# Patient Record
Sex: Male | Born: 1960 | Race: Black or African American | Hispanic: No | Marital: Married | Smoking: Current every day smoker
Health system: Southern US, Community
[De-identification: ages and names within clinical notes are randomized; demographics above are authoritative.]

## PROBLEM LIST (undated history)

## (undated) DIAGNOSIS — R569 Unspecified convulsions: Secondary | ICD-10-CM

## (undated) DIAGNOSIS — Z8669 Personal history of other diseases of the nervous system and sense organs: Secondary | ICD-10-CM

## (undated) HISTORY — PX: HEMORRHOID SURGERY: SHX153

## (undated) HISTORY — PX: SHOULDER OPEN ROTATOR CUFF REPAIR: SHX2407

## (undated) HISTORY — DX: Unspecified convulsions: R56.9

---

## 2017-09-08 HISTORY — PX: SHOULDER ARTHROSCOPY WITH ROTATOR CUFF REPAIR: SHX5685

## 2020-04-19 ENCOUNTER — Ambulatory Visit: Payer: Self-pay

## 2020-05-07 ENCOUNTER — Emergency Department (HOSPITAL_COMMUNITY)
Admission: EM | Admit: 2020-05-07 | Discharge: 2020-05-07 | Disposition: A | Discharge status: Home / Self Care | Payer: Medicare Other | Attending: Emergency Medicine | Admitting: Emergency Medicine

## 2020-05-07 ENCOUNTER — Other Ambulatory Visit: Payer: Self-pay

## 2020-05-07 ENCOUNTER — Emergency Department (HOSPITAL_COMMUNITY): Payer: Medicare Other

## 2020-05-07 DIAGNOSIS — R519 Headache, unspecified: Secondary | ICD-10-CM | POA: Diagnosis not present

## 2020-05-07 DIAGNOSIS — G40909 Epilepsy, unspecified, not intractable, without status epilepticus: Secondary | ICD-10-CM | POA: Insufficient documentation

## 2020-05-07 LAB — BASIC METABOLIC PANEL
Anion gap: 9 (ref 5–15)
BUN: 13 mg/dL (ref 6–20)
CO2: 22 mmol/L (ref 22–32)
Calcium: 9.3 mg/dL (ref 8.9–10.3)
Chloride: 109 mmol/L (ref 98–111)
Creatinine, Ser: 0.95 mg/dL (ref 0.61–1.24)
GFR calc Af Amer: >60 mL/min (ref >60)
GFR calc non Af Amer: >60 mL/min (ref >60)
Glucose, Bld: 97 mg/dL (ref 70–99)
Potassium: 3.7 mmol/L (ref 3.5–5.1)
Sodium: 140 mmol/L (ref 135–145)

## 2020-05-07 LAB — TROPONIN I (HIGH SENSITIVITY): Troponin I (High Sensitivity): 3 ng/L (ref <18)

## 2020-05-07 LAB — CBC
HCT: 48.8 % (ref 39.0–52.0)
Hemoglobin: 15.4 g/dL (ref 13.0–17.0)
MCH: 29.1 pg (ref 26.0–34.0)
MCHC: 31.6 g/dL (ref 30.0–36.0)
MCV: 92.2 fL (ref 80.0–100.0)
Platelets: 165 10*3/uL (ref 150–400)
RBC: 5.29 MIL/uL (ref 4.22–5.81)
RDW: 15.1 % (ref 11.5–15.5)
WBC: 5 10*3/uL (ref 4.0–10.5)
nRBC: 0 % (ref 0.0–0.2)

## 2020-05-07 MED ORDER — LEVETIRACETAM 500 MG PO TABS: 1000.0000 mg | ORAL_TABLET | Freq: Every evening | ORAL | 0 refills | Status: DC

## 2020-05-07 NOTE — ED Provider Notes (Signed)
[facility] EMERGENCY DEPARTMENT Provider Note   CSN: [id number] Arrival date & time: 05/07/20  1117     History Chief Complaint  Patient presents with   Seizures    Jeffery Simmons is a 59 y.o. male with reported history of seizures previously on Keppra presents to the ED for evaluation of seizure.  Patient tells me that he woke up this morning with a mild headache.  The headache gradually worsened while at work.  He walked into the bathroom and splashed some water in his face and neck and when he was walking out of the bathroom he remembers feeling lightheaded but after that he does remember what happened.  They told him that he had a seizure.  Continues to report a mild headache.  Admits to having being diagnosed with seizures in the past with an EEG.  He used to be on Keppra 500 mg twice a day but has not been taking this medicine for 2 to 3 months.  He recently relocated from [place] and has not seen a neurologist for refills.  Denies any other symptoms including vision changes, nausea, vomiting, neck pain.  No one-sided weakness or numbness.  Denies tongue biting or bladder or bowel incontinence.  States he usually gets a headache right before and after having a seizure.  Denies illicit drug or alcohol use.  HPI     No past medical history on file.  There are no problems to display for this patient.   ** The histories are not reviewed yet. Please review them in the "History" navigator section and refresh this SmartLink.     No family history on file.  Social History   Tobacco Use   Smoking status: Not on file  Substance Use Topics   Alcohol use: Not on file   Drug use: Not on file    Home Medications Prior to Admission medications   Medication Sig Start Date End Date Taking? Authorizing Provider  levETIRAcetam (KEPPRA) 500 MG tablet Take 2 tablets (1,000 mg total) by mouth at bedtime. 05/07/20 06/06/20  Liberty Handy, PA-C     Allergies    Patient has no allergy information on record.  Review of Systems   Review of Systems  Neurological: Positive for seizures.  All other systems reviewed and are negative.  Physical Exam Updated Vital Signs BP (!) 139/95 (BP Location: Right Arm)    Pulse (!) 49    Temp 98.1 F (36.7 C) (Oral)    Resp 12    Ht 5\' 9"  (1.753 m)    Wt 80.3 kg    SpO2 100%    BMI 26.14 kg/m   Physical Exam Vitals and nursing note reviewed.  Constitutional:      General: He is not in acute distress.    Appearance: He is well-developed.     Comments: NAD.  HENT:     Head: Normocephalic and atraumatic.     Comments: No facial or scalp tenderness, signs of trauma.    Right Ear: External ear normal.     Left Ear: External ear normal.     Nose: Nose normal.     Mouth/Throat:     Comments: No intraoral or tongue injury. Eyes:     General: No scleral icterus.    Conjunctiva/sclera: Conjunctivae normal.  Cardiovascular:     Rate and Rhythm: Normal rate and regular rhythm.     Heart sounds: Normal heart sounds. No murmur heard.   Pulmonary:  Effort: Pulmonary effort is normal.     Breath sounds: Normal breath sounds. No wheezing.  Musculoskeletal:        General: No deformity. Normal range of motion.     Cervical back: Normal range of motion and neck supple.  Skin:    General: Skin is warm and dry.     Capillary Refill: Capillary refill takes less than 2 seconds.  Neurological:     Mental Status: He is alert and oriented to person, place, and time.     Comments:  Mental Status: Patient is awake, alert, oriented to person, place, year, and situation. Patient is able to give a clear and coherent history.  Speech is fluent and clear without dysarthria or aphasia.  No signs of neglect.  Cranial Nerves: I not tested II visual fields full bilaterally. PERRL.  Unable to visualize posterior eye. III, IV, VI EOMs intact without ptosis or diplopia  V sensation to light touch intact in  all 3 divisions of trigeminal nerve bilaterally  VII facial movements symmetric bilaterally VIII hearing intact to voice/conversation  IX, X no uvula deviation, symmetric rise of soft palate/uvula XI 5/5 SCM and trapezius strength bilaterally  XII tongue protrusion midline, symmetric L/R movements  Motor: Strength 5/5 in upper/lower extremities.  Sensation to light touch intact in face, upper/lower extremities. No pronator drift. No leg drop.  Cerebellar: No ataxia with finger to nose. No truncal sway.   Psychiatric:        Behavior: Behavior normal.        Thought Content: Thought content normal.        Judgment: Judgment normal.     ED Results / Procedures / Treatments   Labs (all labs ordered are listed, but only abnormal results are displayed) Labs Reviewed  BASIC METABOLIC PANEL  CBC  CBG MONITORING, ED  TROPONIN I (HIGH SENSITIVITY)  TROPONIN I (HIGH SENSITIVITY)    EKG EKG Interpretation  Date/Time:  Monday May 07 2020 11:30:44 EDT Ventricular Rate:  60 PR Interval:  156 QRS Duration: 82 QT Interval:  402 QTC Calculation: 402 R Axis:   45 Text Interpretation: Normal sinus rhythm Confirmed by Virgina Norfolk [phone]) on 05/07/2020 11:45:17 AM   Radiology CT Head Wo Contrast  Result Date: 05/07/2020 CLINICAL DATA:  Headache, seizure EXAM: CT HEAD WITHOUT CONTRAST TECHNIQUE: Contiguous axial images were obtained from the base of the skull through the vertex without intravenous contrast. COMPARISON:  None available. FINDINGS: Brain: No evidence of acute infarction, hemorrhage, hydrocephalus, extra-axial collection or mass lesion/mass effect. Vascular: No hyperdense vessel or unexpected calcification. Skull: Normal. Negative for fracture or focal lesion. Sinuses/Orbits: No acute finding. Other: None. IMPRESSION: No acute intracranial findings. Electronically Signed   By: Duanne Guess D.O.   On: 05/07/2020 12:27    Procedures Procedures (including critical care  time)  Medications Ordered in ED Medications - No data to display  ED Course  I have reviewed the triage vital signs and the nursing notes.  Pertinent labs & imaging results that were available during my care of the patient were reviewed by me and considered in my medical decision making (see chart for details).    MDM Rules/Calculators/A&P                          EMR, triage nursing notes reviewed to assist with MDM and obtain more history.  Patient has not been seen in our system before.  59 year old male presents for  evaluation of seizure activity that was witnessed at work.  Reports prodromal and post seizure headache that he states he typically gets after his seizures.  Tells me he has been formally diagnosed with seizure disorder with an EEG and is supposed to be taking Keppra but has been noncompliant.  ER work-up initiated in triage including lab work, CT, EKG, troponin.  Your work-up personally visualized and interpreted.  CBC, BMP normal.  Troponin undetectable.  He had no cardiac symptoms to warrant a repeat troponin.  CT head is nonacute.  EKG shows normal sinus rhythm.  Exam is benign.  No neuro deficits.  He denies significant illicit drug or alcohol use.  Differential diagnosis includes breakthrough seizure from noncompliance with antiepileptic.  Will discharge with Keppra, neurology follow-up.  Return precautions discussed.  He is comfortable with this plan. Final Clinical Impression(s) / ED Diagnoses Final diagnoses:  Seizure disorder [facility])    Rx / DC Orders ED Discharge Orders         Ordered    levETIRAcetam (KEPPRA) 500 MG tablet  Nightly        05/07/20 1423           Liberty Handy, PA-C 05/07/20 1512    Melene Plan, DO 05/07/20 1531

## 2020-05-07 NOTE — Discharge Instructions (Signed)
You were seen in the ED for seizure  Lab work, head CT all were normal  I suspect your breakthrough seizure was from missing your Keppra the last few months  I have sent a prescription for Keppra 500 mg that you can take every night.  Please contact neurology clinic to establish care and make an appointment with neurology/seizure doctor and for future refills  Return to the ED for return or recurrent seizures, severe sudden or different headaches, vision changes, stroke symptoms

## 2020-05-07 NOTE — ED Triage Notes (Signed)
Pt had a headache this am while at work, and had an episode of syncope, hit head- had EMS at scene but did not want to go to [employer]-- Had a seizure, witnessed by coworker-  Hx of seizures, was on Kepra none for > 1-2 months--

## 2020-05-15 ENCOUNTER — Encounter: Payer: Self-pay | Admitting: Family Medicine

## 2020-05-15 ENCOUNTER — Other Ambulatory Visit: Payer: Self-pay

## 2020-05-15 ENCOUNTER — Ambulatory Visit (INDEPENDENT_AMBULATORY_CARE_PROVIDER_SITE_OTHER): Payer: Medicare Other | Admitting: Family Medicine

## 2020-05-15 VITALS — BP 122/76 | HR 59 | Temp 97.6°F | Ht 71.0 in | Wt 177.0 lb

## 2020-05-15 DIAGNOSIS — G40909 Epilepsy, unspecified, not intractable, without status epilepticus: Secondary | ICD-10-CM | POA: Diagnosis not present

## 2020-05-15 DIAGNOSIS — Z720 Tobacco use: Secondary | ICD-10-CM

## 2020-05-15 NOTE — Progress Notes (Signed)
Established Patient Office Visit  Subjective:  Patient ID: Jeffery Simmons, male    DOB: August 16, 1961  Age: 59 y.o. MRN: [id number]  CC:  Chief Complaint  Patient presents with  . Establish Care    hospital fu seizures, headaches, will need a work note to return to work    HPI Jeffery Simmons presents for hospital discharge follow-up status post experiencing a seizure on 30 August.  Apparently he has a history of seizure disorder and had discontinued his medicine for 2 to 3 months prior to the event.  History of generalized headaches that tend to occur after he forgets to eat.  Recently moved into the area from [place].  He had been placed on Keppra 500 mg 2 nightly by emergency room staff.  He saw his primary doctor in [place] who changed the dosage to 250 mg twice daily.  This is been his standing dose of Keppra that he had taken for years prior to discontinuation.  He has been seizure-free on this dosage for years.  He is accompanied by his wife today.  Past Medical History:  Diagnosis Date  . Seizures (HCC)     Past Surgical History:  Procedure Laterality Date  . HEMORRHOID SURGERY    . HEMORRHOID SURGERY    . SHOULDER ARTHROSCOPY WITH ROTATOR CUFF REPAIR Bilateral 2019  . SHOULDER OPEN ROTATOR CUFF REPAIR Bilateral 2019 2020    Family History  Problem Relation Age of Onset  . Stroke Mother     Social History   Socioeconomic History  . Marital status: Married    Spouse name: Not on file  . Number of children: Not on file  . Years of education: Not on file  . Highest education level: Not on file  Occupational History  . Not on file  Tobacco Use  . Smoking status: Current Every Day Smoker    Packs/day: 1.00    Types: Cigarettes  . Smokeless tobacco: Never Used  Vaping Use  . Vaping Use: Never used  Substance and Sexual Activity  . Alcohol use: Never  . Drug use: Never  . Sexual activity: Yes  Other Topics Concern  . Not on file  Social History  Narrative  . Not on file   Social Determinants of Health   Financial Resource Strain:   . Difficulty of Paying Living Expenses: Not on file  Food Insecurity:   . Worried About Programme researcher, broadcasting/film/video in the Last Year: Not on file  . Ran Out of Food in the Last Year: Not on file  Transportation Needs:   . Lack of Transportation (Medical): Not on file  . Lack of Transportation (Non-Medical): Not on file  Physical Activity:   . Days of Exercise per Week: Not on file  . Minutes of Exercise per Session: Not on file  Stress:   . Feeling of Stress : Not on file  Social Connections:   . Frequency of Communication with Friends and Family: Not on file  . Frequency of Social Gatherings with Friends and Family: Not on file  . Attends Religious Services: Not on file  . Active Member of Clubs or Organizations: Not on file  . Attends Banker Meetings: Not on file  . Marital Status: Not on file  Intimate Partner Violence:   . Fear of Current or Ex-Partner: Not on file  . Emotionally Abused: Not on file  . Physically Abused: Not on file  . Sexually Abused: Not on file  Outpatient Medications Prior to Visit  Medication Sig Dispense Refill  . levETIRAcetam (KEPPRA) 500 MG tablet Take 2 tablets (1,000 mg total) by mouth at bedtime. 60 tablet 0   No facility-administered medications prior to visit.    Allergies  Allergen Reactions  . Tylenol [Acetaminophen]     itching    ROS Review of Systems  Constitutional: Negative.   HENT: Negative.   Eyes: Negative for photophobia and visual disturbance.  Respiratory: Negative.   Cardiovascular: Negative.   Gastrointestinal: Negative.   Endocrine: Negative for polyphagia and polyuria.  Genitourinary: Negative.   Musculoskeletal: Negative for gait problem and joint swelling.  Skin: Negative for pallor and rash.  Neurological: Positive for seizures and headaches. Negative for tremors and speech difficulty.  Hematological: Does not  bruise/bleed easily.  Psychiatric/Behavioral: Negative.       Objective:    Physical Exam Vitals and nursing note reviewed.  Constitutional:      General: He is not in acute distress.    Appearance: Normal appearance. He is normal weight. He is not ill-appearing, toxic-appearing or diaphoretic.  HENT:     Head: Normocephalic and atraumatic.     Right Ear: Tympanic membrane, ear canal and external ear normal.     Left Ear: Tympanic membrane, ear canal and external ear normal.     Nose: Nose normal.     Mouth/Throat:     Mouth: Mucous membranes are dry.     Pharynx: Oropharynx is clear.  Eyes:     General: No scleral icterus.       Right eye: No discharge.        Left eye: No discharge.     Extraocular Movements: Extraocular movements intact.     Conjunctiva/sclera: Conjunctivae normal.     Pupils: Pupils are equal, round, and reactive to light.  Cardiovascular:     Rate and Rhythm: Normal rate and regular rhythm.  Pulmonary:     Effort: Pulmonary effort is normal.     Breath sounds: Normal breath sounds.  Musculoskeletal:     Cervical back: No rigidity.     Right lower leg: No edema.     Left lower leg: No edema.  Lymphadenopathy:     Cervical: No cervical adenopathy.  Skin:    General: Skin is warm and dry.  Neurological:     Mental Status: He is alert.     Cranial Nerves: No cranial nerve deficit, dysarthria or facial asymmetry.  Psychiatric:        Mood and Affect: Mood normal.        Behavior: Behavior normal.     BP 122/76   Pulse (!) 59   Temp 97.6 F (36.4 C) (Temporal)   Ht 5\' 11"  (1.803 m)   Wt 177 lb (80.3 kg)   SpO2 98%   BMI 24.69 kg/m  Wt Readings from Last 3 Encounters:  05/15/20 177 lb (80.3 kg)  05/07/20 177 lb (80.3 kg)     Health Maintenance Due  Topic Date Due  . Hepatitis C Screening  Never done  . HIV Screening  Never done  . TETANUS/TDAP  Never done  . COLONOSCOPY  Never done  . INFLUENZA VACCINE  Never done    There are no  preventive care reminders to display for this patient.  No results found for: TSH Lab Results  Component Value Date   WBC 5.0 05/07/2020   HGB 15.4 05/07/2020   HCT 48.8 05/07/2020   MCV 92.2 05/07/2020  PLT 165 05/07/2020   Lab Results  Component Value Date   NA 140 05/07/2020   K 3.7 05/07/2020   CO2 22 05/07/2020   GLUCOSE 97 05/07/2020   BUN 13 05/07/2020   CREATININE 0.95 05/07/2020   CALCIUM 9.3 05/07/2020   ANIONGAP 9 05/07/2020   No results found for: CHOL No results found for: HDL No results found for: LDLCALC No results found for: TRIG No results found for: CHOLHDL No results found for: QMVH8I    Assessment & Plan:   Problem List Items Addressed This Visit      Nervous and Auditory   Seizure disorder [facility]) - Primary   Relevant Orders   Ambulatory referral to Neurology     Other   Tobacco use      No orders of the defined types were placed in this encounter.   Follow-up: Return in about 3 months (around 08/14/2020), or For physical exam..   Given information on seizure disorder and also coping with quitting smoking.  We discussed the importance of remaining on his seizure medications. Mliss Sax, MD

## 2020-05-15 NOTE — Patient Instructions (Signed)
Epilepsy Epilepsy is when a person keeps having seizures. A seizure is a burst of abnormal activity in the brain. A seizure can change how you think or behave, and it can make it hard to be aware of what is happening. This condition can cause problems such as:  Falls, accidents, and injury.  Sadness (depression).  Poor memory.  Sudden unexplained death in epilepsy (SUDEP). This is rare. Its cause is not known. Most people with epilepsy lead normal lives. What are the causes? This condition may be caused by:  A head injury.  An injury that happens at birth.  A high fever during childhood.  A stroke.  Bleeding that goes into or around the brain.  Certain medicines and drugs.  Having too little oxygen for a long period of time.  Abnormal brain development.  Certain infections.  Brain tumors.  Conditions that are passed from parent to child (are hereditary). What are the signs or symptoms? Symptoms of a seizure vary from person to person. They may include:  Jerky movements of muscles (convulsions).  Stiffening of the body.  Movements of the arms or legs that you are not able to control.  Passing out (loss of consciousness).  Breathing problems.  Sudden falls.  Confusion.  Head nodding.  Eye blinking or twitching.  Lip smacking.  Drooling.  Fast eye movements.  Grunting.  Not being able to control when you pee or poop.  Staring.  Being hard to wake up (unresponsiveness). Some people have symptoms right before a seizure happens (aura) and right after a seizure happens. These symptoms include:  Fear or anxiety.  Feeling sick to your stomach (nauseous).  Feeling like the room is spinning (vertigo).  A feeling of having seen or heard something before (dj vu).  Odd tastes or smells.  Changes in how you see (vision), such as seeing flashing lights or spots. Symptoms that follow a seizure include:  Being confused.  Being sleepy.  Having a  headache. How is this treated? Treatment can control seizures. Treatment for this condition may involve:  Taking medicines to control seizures.  Having a device (vagus nerve stimulator) put in the chest. The device sends signals to a nerve and to the brain to prevent seizures.  Brain surgery to stop seizures from happening or to reduce how often they happen.  Having blood tests often to make sure you are getting the right amount of medicine. Once this condition has been diagnosed, it is important to start treatment as soon as possible. For some people, epilepsy goes away in time. Others will need treatment for the rest of their life. Follow these instructions at home: Medicines  Take over-the-counter and prescription medicines only as told by your doctor.  Avoid anything that may keep your medicine from working, such as alcohol. Activity  Get enough rest. Lack of sleep can make seizures more likely to occur.  Follow your doctor's advice about driving, swimming, and doing anything else that would be dangerous if you had a seizure. ? If you live in the [place] check with your local DMV (department of motor vehicles) to find out about local driving laws. Each state has rules about when you can return to driving. Teaching others Teach friends and family what to do if you have a seizure. They should:  Lay you on the ground to prevent a fall.  Cushion your head and body.  Loosen any tight clothing around your neck.  Turn you on your side.  Stay with   you until you are better.  Not hold you down.  Not put anything in your mouth.  Know whether or not you need emergency care.  General instructions  Avoid anything that causes you to have seizures.  Keep a seizure diary. Write down what you remember about each seizure. Be sure to include what might have caused it.  Keep all follow-up visits as told by your doctor. This is important. Contact a doctor if:  You have a change in how  often or when you have seizures.  You get an infection or start to feel sick. You may have more seizures when you are sick. Get help right away if:  A seizure does not stop after 5 minutes.  You have more than one seizure in a row, and you do not have enough time between the seizures to feel better.  A seizure makes it harder to breathe.  A seizure is different from other seizures you have had.  A seizure makes you unable to speak or use a part of your body.  You did not wake up right after a seizure. These symptoms may be an emergency. Do not wait to see if the symptoms will go away. Get medical help right away. Call your local emergency services (911 in the [place] Do not drive yourself to the hospital. Summary  Epilepsy is when a person keeps having seizures. A seizure is a burst of abnormal activity in the brain.  Treatment can control seizures.  Teach friends and family what to do if you have a seizure. This information is not intended to replace advice given to you by your health care provider. Make sure you discuss any questions you have with your health care provider. Document Revised: 04/19/2018 Document Reviewed: 04/19/2018 Elsevier Patient Education  2020 [employer].  Coping with Quitting Smoking  Quitting smoking is a physical and mental challenge. You will face cravings, withdrawal symptoms, and temptation. Before quitting, work with your health care provider to make a plan that can help you cope. Preparation can help you quit and keep you from giving in. How can I cope with cravings? Cravings usually last for 5-10 minutes. If you get through it, the craving will pass. Consider taking the following actions to help you cope with cravings:  Keep your mouth busy: ? Chew sugar-free gum. ? Suck on hard candies or a straw. ? Brush your teeth.  Keep your hands and body busy: ? Immediately change to a different activity when you feel a craving. ? Squeeze or play with a  ball. ? Do an activity or a hobby, like making bead jewelry, practicing needlepoint, or working with wood. ? Mix up your normal routine. ? Take a short exercise break. Go for a quick walk or run up and down stairs. ? Spend time in public places where smoking is not allowed.  Focus on doing something kind or helpful for someone else.  Call a friend or family member to talk during a craving.  Join a support group.  Call a quit line, such as 1-800-QUIT-NOW.  Talk with your health care provider about medicines that might help you cope with cravings and make quitting easier for you. How can I deal with withdrawal symptoms? Your body may experience negative effects as it tries to get used to not having nicotine in the system. These effects are called withdrawal symptoms. They may include:  Feeling hungrier than normal.  Trouble concentrating.  Irritability.  Trouble sleeping.  Feeling depressed.  Restlessness and agitation.  Craving a cigarette. To manage withdrawal symptoms:  Avoid places, people, and activities that trigger your cravings.  Remember why you want to quit.  Get plenty of sleep.  Avoid coffee and other caffeinated drinks. These may worsen some of your symptoms. How can I handle social situations? Social situations can be difficult when you are quitting smoking, especially in the first few weeks. To manage this, you can:  Avoid parties, bars, and other social situations where people might be smoking.  Avoid alcohol.  Leave right away if you have the urge to smoke.  Explain to your family and friends that you are quitting smoking. Ask for understanding and support.  Plan activities with friends or family where smoking is not an option. What are some ways I can cope with stress? Wanting to smoke may cause stress, and stress can make you want to smoke. Find ways to manage your stress. Relaxation techniques can help. For example:  Breathe slowly and deeply,  in through your nose and out through your mouth.  Listen to soothing, relaxing music.  Talk with a family member or friend about your stress.  Light a candle.  Soak in a bath or take a shower.  Think about a peaceful place. What are some ways I can prevent weight gain? Be aware that many people gain weight after they quit smoking. However, not everyone does. To keep from gaining weight, have a plan in place before you quit and stick to the plan after you quit. Your plan should include:  Having healthy snacks. When you have a craving, it may help to: ? Eat plain popcorn, crunchy carrots, celery, or other cut vegetables. ? Chew sugar-free gum.  Changing how you eat: ? Eat small portion sizes at meals. ? Eat 4-6 small meals throughout the day instead of 1-2 large meals a day. ? Be mindful when you eat. Do not watch television or do other things that might distract you as you eat.  Exercising regularly: ? Make time to exercise each day. If you do not have time for a long workout, do short bouts of exercise for 5-10 minutes several times a day. ? Do some form of strengthening exercise, like weight lifting, and some form of aerobic exercise, like running or swimming.  Drinking plenty of water or other low-calorie or no-calorie drinks. Drink 6-8 glasses of water daily, or as much as instructed by your health care provider. Summary  Quitting smoking is a physical and mental challenge. You will face cravings, withdrawal symptoms, and temptation to smoke again. Preparation can help you as you go through these challenges.  You can cope with cravings by keeping your mouth busy (such as by chewing gum), keeping your body and hands busy, and making calls to family, friends, or a helpline for people who want to quit smoking.  You can cope with withdrawal symptoms by avoiding places where people smoke, avoiding drinks with caffeine, and getting plenty of rest.  Ask your health care provider about  the different ways to prevent weight gain, avoid stress, and handle social situations. This information is not intended to replace advice given to you by your health care provider. Make sure you discuss any questions you have with your health care provider. Document Revised: 08/07/2017 Document Reviewed: 08/22/2016 Elsevier Patient Education  2020 [employer].

## 2020-06-05 ENCOUNTER — Encounter: Payer: Self-pay | Admitting: Family Medicine

## 2020-08-10 ENCOUNTER — Ambulatory Visit: Payer: Medicare Other | Admitting: Family Medicine

## 2020-09-14 ENCOUNTER — Encounter: Payer: Self-pay | Admitting: Diagnostic Neuroimaging

## 2020-09-14 ENCOUNTER — Ambulatory Visit: Payer: Medicare Other | Admitting: Diagnostic Neuroimaging

## 2020-11-23 ENCOUNTER — Ambulatory Visit: Payer: Medicare Other | Admitting: Diagnostic Neuroimaging

## 2020-12-31 ENCOUNTER — Ambulatory Visit (INDEPENDENT_AMBULATORY_CARE_PROVIDER_SITE_OTHER): Payer: Medicare Other

## 2020-12-31 ENCOUNTER — Ambulatory Visit (INDEPENDENT_AMBULATORY_CARE_PROVIDER_SITE_OTHER): Payer: Medicare Other | Admitting: Family Medicine

## 2020-12-31 ENCOUNTER — Other Ambulatory Visit: Payer: Self-pay

## 2020-12-31 ENCOUNTER — Encounter: Payer: Self-pay | Admitting: Family Medicine

## 2020-12-31 VITALS — BP 132/80 | HR 82 | Temp 97.7°F | Ht 71.0 in | Wt 172.2 lb

## 2020-12-31 DIAGNOSIS — E291 Testicular hypofunction: Secondary | ICD-10-CM | POA: Insufficient documentation

## 2020-12-31 DIAGNOSIS — M25519 Pain in unspecified shoulder: Secondary | ICD-10-CM

## 2020-12-31 DIAGNOSIS — Z0000 Encounter for general adult medical examination without abnormal findings: Secondary | ICD-10-CM

## 2020-12-31 DIAGNOSIS — G40909 Epilepsy, unspecified, not intractable, without status epilepticus: Secondary | ICD-10-CM

## 2020-12-31 DIAGNOSIS — Z720 Tobacco use: Secondary | ICD-10-CM | POA: Diagnosis not present

## 2020-12-31 DIAGNOSIS — Z9889 Other specified postprocedural states: Secondary | ICD-10-CM

## 2020-12-31 MED ORDER — MELOXICAM 7.5 MG PO TABS: 7.5000 mg | ORAL_TABLET | Freq: Every day | ORAL | 0 refills | Status: DC

## 2020-12-31 NOTE — Patient Instructions (Signed)
Health Maintenance, Male Adopting a healthy lifestyle and getting preventive care are important in promoting health and wellness. Ask your health care provider about:  The right schedule for you to have regular tests and exams.  Things you can do on your own to prevent diseases and keep yourself healthy. What should I know about diet, weight, and exercise? Eat a healthy diet  Eat a diet that includes plenty of vegetables, fruits, low-fat dairy products, and lean protein.  Do not eat a lot of foods that are high in solid fats, added sugars, or sodium.   Maintain a healthy weight Body mass index (BMI) is a measurement that can be used to identify possible weight problems. It estimates body fat based on height and weight. Your health care provider can help determine your BMI and help you achieve or maintain a healthy weight. Get regular exercise Get regular exercise. This is one of the most important things you can do for your health. Most adults should:  Exercise for at least 150 minutes each week. The exercise should increase your heart rate and make you sweat (moderate-intensity exercise).  Do strengthening exercises at least twice a week. This is in addition to the moderate-intensity exercise.  Spend less time sitting. Even light physical activity can be beneficial. Watch cholesterol and blood lipids Have your blood tested for lipids and cholesterol at 59 years of age, then have this test every 5 years. You may need to have your cholesterol levels checked more often if:  Your lipid or cholesterol levels are high.  You are older than 59 years of age.  You are at high risk for heart disease. What should I know about cancer screening? Many types of cancers can be detected early and may often be prevented. Depending on your health history and family history, you may need to have cancer screening at various ages. This may include screening for:  Colorectal cancer.  Prostate  cancer.  Skin cancer.  Lung cancer. What should I know about heart disease, diabetes, and high blood pressure? Blood pressure and heart disease  High blood pressure causes heart disease and increases the risk of stroke. This is more likely to develop in people who have high blood pressure readings, are of African descent, or are overweight.  Talk with your health care provider about your target blood pressure readings.  Have your blood pressure checked: ? Every 3-5 years if you are 18-39 years of age. ? Every year if you are 40 years old or older.  If you are between the ages of 65 and 75 and are a current or former smoker, ask your health care provider if you should have a one-time screening for abdominal aortic aneurysm (AAA). Diabetes Have regular diabetes screenings. This checks your fasting blood sugar level. Have the screening done:  Once every three years after age 45 if you are at a normal weight and have a low risk for diabetes.  More often and at a younger age if you are overweight or have a high risk for diabetes. What should I know about preventing infection? Hepatitis B If you have a higher risk for hepatitis B, you should be screened for this virus. Talk with your health care provider to find out if you are at risk for hepatitis B infection. Hepatitis C Blood testing is recommended for:  Everyone born from 1945 through 1965.  Anyone with known risk factors for hepatitis C. Sexually transmitted infections (STIs)  You should be screened each   year for STIs, including gonorrhea and chlamydia, if: ? You are sexually active and are younger than 59 years of age. ? You are older than 59 years of age and your health care provider tells you that you are at risk for this type of infection. ? Your sexual activity has changed since you were last screened, and you are at increased risk for chlamydia or gonorrhea. Ask your health care provider if you are at risk.  Ask your  health care provider about whether you are at high risk for HIV. Your health care provider may recommend a prescription medicine to help prevent HIV infection. If you choose to take medicine to prevent HIV, you should first get tested for HIV. You should then be tested every 3 months for as long as you are taking the medicine. Follow these instructions at home: Lifestyle  Do not use any products that contain nicotine or tobacco, such as cigarettes, e-cigarettes, and chewing tobacco. If you need help quitting, ask your health care provider.  Do not use street drugs.  Do not share needles.  Ask your health care provider for help if you need support or information about quitting drugs. Alcohol use  Do not drink alcohol if your health care provider tells you not to drink.  If you drink alcohol: ? Limit how much you have to 0-2 drinks a day. ? Be aware of how much alcohol is in your drink. In the [place] one drink equals one 12 oz bottle of beer (355 mL), one 5 oz glass of wine (148 mL), or one 1 oz glass of hard liquor (44 mL). General instructions  Schedule regular health, dental, and eye exams.  Stay current with your vaccines.  Tell your health care provider if: ? You often feel depressed. ? You have ever been abused or do not feel safe at home. Summary  Adopting a healthy lifestyle and getting preventive care are important in promoting health and wellness.  Follow your health care provider's instructions about healthy diet, exercising, and getting tested or screened for diseases.  Follow your health care provider's instructions on monitoring your cholesterol and blood pressure. This information is not intended to replace advice given to you by your health care provider. Make sure you discuss any questions you have with your health care provider. Document Revised: 08/18/2018 Document Reviewed: 08/18/2018 Elsevier Patient Education  2021 [place] Years  Old, Male Preventive care refers to lifestyle choices and visits with your health care provider that can promote health and wellness. This includes:  A yearly physical exam. This is also called an annual wellness visit.  Regular dental and eye exams.  Immunizations.  Screening for certain conditions.  Healthy lifestyle choices, such as: ? Eating a healthy diet. ? Getting regular exercise. ? Not using drugs or products that contain nicotine and tobacco. ? Limiting alcohol use. What can I expect for my preventive care visit? Physical exam Your health care provider will check your:  Height and weight. These may be used to calculate your BMI (body mass index). BMI is a measurement that tells if you are at a healthy weight.  Heart rate and blood pressure.  Body temperature.  Skin for abnormal spots. Counseling Your health care provider may ask you questions about your:  Past medical problems.  Family's medical history.  Alcohol, tobacco, and drug use.  Emotional well-being.  Home life and relationship well-being.  Sexual activity.  Diet, exercise, and sleep habits.  Work and work Statistician.  Access to firearms. What immunizations do I need? Vaccines are usually given at various ages, according to a schedule. Your health care provider will recommend vaccines for you based on your age, medical history, and lifestyle or other factors, such as travel or where you work.   What tests do I need? Blood tests  Lipid and cholesterol levels. These may be checked every 5 years, or more often if you are over 44 years old.  Hepatitis C test.  Hepatitis B test. Screening  Lung cancer screening. You may have this screening every year starting at age 77 if you have a 30-pack-year history of smoking and currently smoke or have quit within the past 15 years.  Prostate cancer screening. Recommendations will vary depending on your family history and other risks.  Genital exam  to check for testicular cancer or hernias.  Colorectal cancer screening. ? All adults should have this screening starting at age 36 and continuing until age 28. ? Your health care provider may recommend screening at age 16 if you are at increased risk. ? You will have tests every 1-10 years, depending on your results and the type of screening test.  Diabetes screening. ? This is done by checking your blood sugar (glucose) after you have not eaten for a while (fasting). ? You may have this done every 1-3 years.  STD (sexually transmitted disease) testing, if you are at risk. Follow these instructions at home: Eating and drinking  Eat a diet that includes fresh fruits and vegetables, whole grains, lean protein, and low-fat dairy products.  Take vitamin and mineral supplements as recommended by your health care provider.  Do not drink alcohol if your health care provider tells you not to drink.  If you drink alcohol: ? Limit how much you have to 0-2 drinks a day. ? Be aware of how much alcohol is in your drink. In the [place] one drink equals one 12 oz bottle of beer (355 mL), one 5 oz glass of wine (148 mL), or one 1 oz glass of hard liquor (44 mL).   Lifestyle  Take daily care of your teeth and gums. Brush your teeth every morning and night with fluoride toothpaste. Floss one time each day.  Stay active. Exercise for at least 30 minutes 5 or more days each week.  Do not use any products that contain nicotine or tobacco, such as cigarettes, e-cigarettes, and chewing tobacco. If you need help quitting, ask your health care provider.  Do not use drugs.  If you are sexually active, practice safe sex. Use a condom or other form of protection to prevent STIs (sexually transmitted infections).  If told by your health care provider, take low-dose aspirin daily starting at age 10.  Find healthy ways to cope with stress, such as: ? Meditation, yoga, or listening to  music. ? Journaling. ? Talking to a trusted person. ? Spending time with friends and family. Safety  Always wear your seat belt while driving or riding in a vehicle.  Do not drive: ? If you have been drinking alcohol. Do not ride with someone who has been drinking. ? When you are tired or distracted. ? While texting.  Wear a helmet and other protective equipment during sports activities.  If you have firearms in your house, make sure you follow all gun safety procedures. What's next?  Go to your health care provider once a year for an annual wellness visit.  Ask your health  care provider how often you should have your eyes and teeth checked.  Stay up to date on all vaccines. This information is not intended to replace advice given to you by your health care provider. Make sure you discuss any questions you have with your health care provider. Document Revised: 05/24/2019 Document Reviewed: 08/19/2018 Elsevier Patient Education  2021 Else[employer] Inc.  Steps to Quit Smoking Smoking tobacco is the leading cause of preventable death. It can affect almost every organ in the body. Smoking puts you and people around you at risk for many serious, long-lasting (chronic) diseases. Quitting smoking can be hard, but it is one of the best things that you can do for your health. It is never too late to quit. How do I get ready to quit? When you decide to quit smoking, make a plan to help you succeed. Before you quit:  Pick a date to quit. Set a date within the next 2 weeks to give you time to prepare.  Write down the reasons why you are quitting. Keep this list in places where you will see it often.  Tell your family, friends, and co-workers that you are quitting. Their support is important.  Talk with your doctor about the choices that may help you quit.  Find out if your health insurance will pay for these treatments.  Know the people, places, things, and activities that make you want to  smoke (triggers). Avoid them. What first steps can I take to quit smoking?  Throw away all cigarettes at home, at work, and in your car.  Throw away the things that you use when you smoke, such as ashtrays and lighters.  Clean your car. Make sure to empty the ashtray.  Clean your home, including curtains and carpets. What can I do to help me quit smoking? Talk with your doctor about taking medicines and seeing a counselor at the same time. You are more likely to succeed when you do both.  If you are pregnant or breastfeeding, talk with your doctor about counseling or other ways to quit smoking. Do not take medicine to help you quit smoking unless your doctor tells you to do so. To quit smoking: Quit right away  Quit smoking totally, instead of slowly cutting back on how much you smoke over a period of time.  Go to counseling. You are more likely to quit if you go to counseling sessions regularly. Take medicine You may take medicines to help you quit. Some medicines need a prescription, and some you can buy over-the-counter. Some medicines may contain a drug called nicotine to replace the nicotine in cigarettes. Medicines may:  Help you to stop having the desire to smoke (cravings).  Help to stop the problems that come when you stop smoking (withdrawal symptoms). Your doctor may ask you to use:  Nicotine patches, gum, or lozenges.  Nicotine inhalers or sprays.  Non-nicotine medicine that is taken by mouth. Find resources Find resources and other ways to help you quit smoking and remain smoke-free after you quit. These resources are most helpful when you use them often. They include:  Online chats with a Veterinary surgeon.  Phone quitlines.  Printed Materials engineer.  Support groups or group counseling.  Text messaging programs.  Mobile phone apps. Use apps on your mobile phone or tablet that can help you stick to your quit plan. There are many free apps for mobile phones and  tablets as well as websites. Examples include Quit Guide from the [employer]  and smokefree.gov   What things can I do to make it easier to quit?  Talk to your family and friends. Ask them to support and encourage you.  Call a phone quitline (1-800-QUIT-NOW), reach out to support groups, or work with a Veterinary surgeon.  Ask people who smoke to not smoke around you.  Avoid places that make you want to smoke, such as: ? Bars. ? Parties. ? Smoke-break areas at work.  Spend time with people who do not smoke.  Lower the stress in your life. Stress can make you want to smoke. Try these things to help your stress: ? Getting regular exercise. ? Doing deep-breathing exercises. ? Doing yoga. ? Meditating. ? Doing a body scan. To do this, close your eyes, focus on one area of your body at a time from head to toe. Notice which parts of your body are tense. Try to relax the muscles in those areas.   How will I feel when I quit smoking? Day 1 to 3 weeks Within the first 24 hours, you may start to have some problems that come from quitting tobacco. These problems are very bad 2-3 days after you quit, but they do not often last for more than 2-3 weeks. You may get these symptoms:  Mood swings.  Feeling restless, nervous, angry, or annoyed.  Trouble concentrating.  Dizziness.  Strong desire for high-sugar foods and nicotine.  Weight gain.  Trouble pooping (constipation).  Feeling like you may vomit (nausea).  Coughing or a sore throat.  Changes in how the medicines that you take for other issues work in your body.  Depression.  Trouble sleeping (insomnia). Week 3 and afterward After the first 2-3 weeks of quitting, you may start to notice more positive results, such as:  Better sense of smell and taste.  Less coughing and sore throat.  Slower heart rate.  Lower blood pressure.  Clearer skin.  Better breathing.  Fewer sick days. Quitting smoking can be hard. Do not give up if you  fail the first time. Some people need to try a few times before they succeed. Do your best to stick to your quit plan, and talk with your doctor if you have any questions or concerns. Summary  Smoking tobacco is the leading cause of preventable death. Quitting smoking can be hard, but it is one of the best things that you can do for your health.  When you decide to quit smoking, make a plan to help you succeed.  Quit smoking right away, not slowly over a period of time.  When you start quitting, seek help from your doctor, family, or friends. This information is not intended to replace advice given to you by your health care provider. Make sure you discuss any questions you have with your health care provider. Document Revised: 05/20/2019 Document Reviewed: 11/13/2018 Elsevier Patient Education  2021 Elsevier Inc. Varenicline oral tablets What is this medicine? VARENICLINE (var e NI kleen) is used to help people quit smoking. It is used with a patient support program recommended by your physician. This medicine may be used for other purposes; ask your health care provider or pharmacist if you have questions. COMMON BRAND NAME(S): Chantix What should I tell my health care provider before I take this medicine? They need to know if you have any of these conditions:  heart disease  if you often drink alcohol  kidney disease  mental illness  on hemodialysis  seizures  history of stroke  suicidal thoughts, plans,  or attempt; a previous suicide attempt by you or a family member  an unusual or allergic reaction to varenicline, other medicines, foods, dyes, or preservatives  pregnant or trying to get pregnant  breast-feeding How should I use this medicine? Take this medicine by mouth after eating. Take with a full glass of water. Follow the directions on the prescription label. Take your doses at regular intervals. Do not take your medicine more often than directed. There are 3  ways you can use this medicine to help you quit smoking; talk to your health care professional to decide which plan is right for you: 1) you can choose a quit date and start this medicine 1 week before the quit date, or, 2) you can start taking this medicine before you choose a quit date, and then pick a quit date between day 8 and 35 days of treatment, or, 3) if you are not sure that you are able or willing to quit smoking right away, start taking this medicine and slowly decrease the amount you smoke as directed by your health care professional with the goal of being cigarette-free by week 12 of treatment. Stick to your plan; ask about support groups or other ways to help you remain cigarette-free. If you are motivated to quit smoking and did not succeed during a previous attempt with this medicine for reasons other than side effects, or if you returned to smoking after this treatment, speak with your health care professional about whether another course of this medicine may be right for you. A special MedGuide will be given to you by the pharmacist with each prescription and refill. Be sure to read this information carefully each time. Talk to your pediatrician regarding the use of this medicine in children. This medicine is not approved for use in children. Overdosage: If you think you have taken too much of this medicine contact a poison control center or emergency room at once. NOTE: This medicine is only for you. Do not share this medicine with others. What if I miss a dose? If you miss a dose, take it as soon as you can. If it is almost time for your next dose, take only that dose. Do not take double or extra doses. What may interact with this medicine?  alcohol  insulin  other medicines used to help people quit smoking  theophylline  warfarin This list may not describe all possible interactions. Give your health care provider a list of all the medicines, herbs, non-prescription drugs,  or dietary supplements you use. Also tell them if you smoke, drink alcohol, or use illegal drugs. Some items may interact with your medicine. What should I watch for while using this medicine? It is okay if you do not succeed at your attempt to quit and have a cigarette. You can still continue your quit attempt and keep using this medicine as directed. Just throw away your cigarettes and get back to your quit plan. Talk to your health care provider before using other treatments to quit smoking. Using this medicine with other treatments to quit smoking may increase the risk for side effects compared to using a treatment alone. You may get drowsy or dizzy. Do not drive, use machinery, or do anything that needs mental alertness until you know how this medicine affects you. Do not stand or sit up quickly, especially if you are an older patient. This reduces the risk of dizzy or fainting spells. Decrease the number of alcoholic beverages that you drink  during treatment with this medicine until you know if this medicine affects your ability to tolerate alcohol. Some people have experienced increased drunkenness (intoxication), unusual or sometimes aggressive behavior, or no memory of things that have happened (amnesia) during treatment with this medicine. Sleepwalking can happen during treatment with this medicine, and can sometimes lead to behavior that is harmful to you, other people, or property. Stop taking this medicine and tell your doctor if you start sleepwalking or have other unusual sleep-related activity. After taking this medicine, you may get up out of bed and do an activity that you do not know you are doing. The next morning, you may have no memory of this. Activities include driving a car ("sleep-driving"), making and eating food, talking on the phone, sexual activity, and sleep-walking. Serious injuries have occurred. Stop the medicine and call your doctor right away if you find out you have done  any of these activities. Do not take this medicine if you have used alcohol that evening. Do not take it if you have taken another medicine for sleep. The risk of doing these sleep-related activities is higher. Patients and their families should watch out for new or worsening depression or thoughts of suicide. Also watch out for sudden changes in feelings such as feeling anxious, agitated, panicky, irritable, hostile, aggressive, impulsive, severely restless, overly excited and hyperactive, or not being able to sleep. If this happens, call your health care professional. If you have diabetes and you quit smoking, the effects of insulin may be increased and you may need to reduce your insulin dose. Check with your doctor or health care professional about how you should adjust your insulin dose. What side effects may I notice from receiving this medicine? Side effects that you should report to your doctor or health care professional as soon as possible:  allergic reactions like skin rash, itching or hives, swelling of the face, lips, tongue, or throat  acting aggressive, being angry or violent, or acting on dangerous impulses  breathing problems  changes in emotions or moods  chest pain or chest tightness  feeling faint or lightheaded, falls  hallucination, loss of contact with reality  mouth sores  redness, blistering, peeling or loosening of the skin, including inside the mouth  signs and symptoms of a stroke like changes in vision; confusion; trouble speaking or understanding; severe headaches; sudden numbness or weakness of the face, arm or leg; trouble walking; dizziness; loss of balance or coordination  seizures  sleepwalking  suicidal thoughts or other mood changes Side effects that usually do not require medical attention (report to your doctor or health care professional if they continue or are bothersome):  constipation  gas  headache  nausea, vomiting  strange  dreams  trouble sleeping This list may not describe all possible side effects. Call your doctor for medical advice about side effects. You may report side effects to FDA at 1-800-FDA-1088. Where should I keep my medicine? Keep out of the reach of children. Store at room temperature between 15 and 30 degrees C (59 and 86 degrees F). Throw away any unused medicine after the expiration date. NOTE: This sheet is a summary. It may not cover all possible information. If you have questions about this medicine, talk to your doctor, pharmacist, or health care provider.  2021 Elsevier/Gold Standard (2018-08-13 14:27:36)

## 2020-12-31 NOTE — Progress Notes (Signed)
Established Patient Office Visit  Subjective:  Patient ID: Jeffery Simmons, male    DOB: 18-Aug-1961  Age: 60 y.o. MRN: [id number]  CC:  Chief Complaint  Patient presents with  . Shoulder Pain    C/O shoulder pain both sides, would like a shot or something for the pain. Patient also states that testosterone levels have been low in the pass he would like levels checked again.     HPI Jeffery Simmons presents for bilateral shoulder pain.  Status post rotator cuff repair on both shoulders he tells me.  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  History of androgen deficiency.  He has tried what sounds like testosterone gel in the past that was not helpful.  History of tobacco use.  He has been smoking since he was 22.  Currently smoking a pack per day.  He understands that he needs to quit.  He has tried patches and gum without success.  He never saw the neurologist because he has to work throughout the day.  Keppra continues to control his seizures.  Past Medical History:  Diagnosis Date  . Seizures (HCC)     Past Surgical History:  Procedure Laterality Date  . HEMORRHOID SURGERY    . HEMORRHOID SURGERY    . SHOULDER ARTHROSCOPY WITH ROTATOR CUFF REPAIR Bilateral 2019  . SHOULDER OPEN ROTATOR CUFF REPAIR Bilateral 2019 2020    Family History  Problem Relation Age of Onset  . Stroke Mother     Social History   Socioeconomic History  . Marital status: Married    Spouse name: Not on file  . Number of children: Not on file  . Years of education: Not on file  . Highest education level: Not on file  Occupational History  . Not on file  Tobacco Use  . Smoking status: Current Every Day Smoker    Packs/day: 1.00    Types: Cigarettes  . Smokeless tobacco: Never Used  Vaping Use  . Vaping Use: Never used  Substance and Sexual Activity  . Alcohol use: Never  . Drug use: Never  . Sexual activity: Yes  Other Topics Concern  . Not on file   Social History Narrative  . Not on file   Social Determinants of Health   Financial Resource Strain: Not on file  Food Insecurity: Not on file  Transportation Needs: Not on file  Physical Activity: Not on file  Stress: Not on file  Social Connections: Not on file  Intimate Partner Violence: Not on file    Outpatient Medications Prior to Visit  Medication Sig Dispense Refill  . levETIRAcetam (KEPPRA) 250 MG tablet Take 250 mg by mouth 2 (two) times daily.     No facility-administered medications prior to visit.    Allergies  Allergen Reactions  . Tylenol [Acetaminophen]     itching    ROS Review of Systems  Constitutional: Negative for diaphoresis, fatigue, fever and unexpected weight change.  HENT: Negative.   Eyes: Negative for photophobia and visual disturbance.  Respiratory: Positive for cough. Negative for chest tightness, shortness of breath and wheezing.   Cardiovascular: Negative for chest pain and palpitations.  Gastrointestinal: Negative.   Endocrine: Negative for polyphagia and polyuria.  Genitourinary: Negative for difficulty urinating, frequency and urgency.  Musculoskeletal: Positive for arthralgias.  Skin: Negative for color change and pallor.  Neurological: Negative for weakness and headaches.  Psychiatric/Behavioral: Negative.       Objective:  Physical Exam Vitals and nursing note reviewed.  Constitutional:      General: He is not in acute distress.    Appearance: Normal appearance. He is normal weight. He is not ill-appearing, toxic-appearing or diaphoretic.  HENT:     Head: Normocephalic and atraumatic.     Right Ear: Tympanic membrane, ear canal and external ear normal.     Left Ear: Tympanic membrane, ear canal and external ear normal.     Mouth/Throat:     Mouth: Mucous membranes are moist.     Pharynx: Oropharynx is clear. No oropharyngeal exudate or posterior oropharyngeal erythema.  Eyes:     General: No scleral icterus.        Right eye: No discharge.        Left eye: No discharge.     Extraocular Movements: Extraocular movements intact.     Conjunctiva/sclera: Conjunctivae normal.     Pupils: Pupils are equal, round, and reactive to light.  Neck:     Vascular: No carotid bruit.  Cardiovascular:     Rate and Rhythm: Normal rate and regular rhythm.  Pulmonary:     Effort: Pulmonary effort is normal.     Breath sounds: Normal breath sounds.  Abdominal:     General: Abdomen is flat. Bowel sounds are normal. There is no distension.     Palpations: Abdomen is soft. There is no mass.     Tenderness: There is no abdominal tenderness. There is no guarding or rebound.     Hernia: A hernia is present. Hernia is present in the left inguinal area (small). There is no hernia in the right inguinal area.  Genitourinary:    Penis: No hypospadias, erythema, tenderness, discharge, swelling or lesions.      Testes:        Right: Mass, tenderness or swelling not present. Right testis is descended.        Left: Mass, tenderness or swelling not present. Left testis is descended.     Epididymis:     Right: Not inflamed.     Left: Not inflamed.     Prostate: Enlarged. Not tender and no nodules present.     Rectum: Guaiac result negative. No mass, tenderness, anal fissure, external hemorrhoid or internal hemorrhoid. Normal anal tone.  Musculoskeletal:     Right shoulder: Normal range of motion.     Left shoulder: Normal range of motion.       Arms:     Cervical back: No rigidity or tenderness.     Right lower leg: No edema.     Left lower leg: No edema.  Lymphadenopathy:     Cervical: No cervical adenopathy.     Lower Body: No right inguinal adenopathy. No left inguinal adenopathy.  Skin:    General: Skin is warm and dry.  Neurological:     Mental Status: He is alert and oriented to person, place, and time.  Psychiatric:        Mood and Affect: Mood normal.        Behavior: Behavior normal.     BP 132/80   Pulse 82    Temp 97.7 F (36.5 C) (Temporal)   Ht 5\' 11"  (1.803 m)   Wt 172 lb 3.2 oz (78.1 kg)   SpO2 95%   BMI 24.02 kg/m  Wt Readings from Last 3 Encounters:  12/31/20 172 lb 3.2 oz (78.1 kg)  05/15/20 177 lb (80.3 kg)  05/07/20 177 lb (80.3 kg)  Health Maintenance Due  Topic Date Due  . Hepatitis C Screening  Never done  . HIV Screening  Never done  . TETANUS/TDAP  Never done  . COLONOSCOPY (Pts 45-62yrs Insurance coverage will need to be confirmed)  Never done    There are no preventive care reminders to display for this patient.  No results found for: TSH Lab Results  Component Value Date   WBC 5.0 05/07/2020   HGB 15.4 05/07/2020   HCT 48.8 05/07/2020   MCV 92.2 05/07/2020   PLT 165 05/07/2020   Lab Results  Component Value Date   NA 140 05/07/2020   K 3.7 05/07/2020   CO2 22 05/07/2020   GLUCOSE 97 05/07/2020   BUN 13 05/07/2020   CREATININE 0.95 05/07/2020   CALCIUM 9.3 05/07/2020   ANIONGAP 9 05/07/2020   No results found for: CHOL No results found for: HDL No results found for: LDLCALC No results found for: TRIG No results found for: CHOLHDL No results found for: KGUR4Y    Assessment & Plan:   Problem List Items Addressed This Visit      Endocrine   Androgen deficiency   Relevant Orders   Testosterone Total,Free,Bio, Males-([facility])     Nervous and Auditory   Seizure disorder (HCC) - Primary   Relevant Orders   Ambulatory referral to Neurology     Other   Tobacco use   Relevant Orders   DG Chest 2 View   Healthcare maintenance   Relevant Orders   CBC   Comprehensive metabolic panel   Lipid panel   HIV Antibody (routine testing w rflx)   PSA   Urinalysis, Routine w reflex microscopic    Other Visit Diagnoses    Shoulder pain with history of repair of rotator cuff       Relevant Medications   meloxicam (MOBIC) 7.5 MG tablet   Other Relevant Orders   Ambulatory referral to Sports Medicine      Meds ordered this encounter   Medications  . meloxicam (MOBIC) 7.5 MG tablet    Sig: Take 1 tablet (7.5 mg total) by mouth daily.    Dispense:  30 tablet    Refill:  0    Follow-up: Return in about 6 months (around 07/02/2021), or if symptoms worsen or fail to improve.   Will return fasting for above ordered blood work.  Patient was given information on health maintenance and disease prevention.  He was also given information on steps to quit smoking.  He has tried patches and gum that did not helped.  Stressed the importance of quitting smoking.  He is interested in trying Chantix.  Was also given information on Chantix.  Advised him to avoid overhead lifting is much as possible.  Tells me that he does a lot of this on his job.  Asked him to consider finding a new job.  Started on low-dose Mobic for shoulder pain.  We are referring him back to neurology for follow-up his seizure disorder.  Emphasized the importance of follow-up with consultant referrals. Mliss Sax, MD

## 2021-01-04 ENCOUNTER — Other Ambulatory Visit: Payer: Medicare Other

## 2021-01-09 ENCOUNTER — Ambulatory Visit (INDEPENDENT_AMBULATORY_CARE_PROVIDER_SITE_OTHER): Payer: Medicare Other | Admitting: Family Medicine

## 2021-01-09 ENCOUNTER — Ambulatory Visit: Payer: Self-pay

## 2021-01-09 ENCOUNTER — Other Ambulatory Visit: Payer: Self-pay

## 2021-01-09 VITALS — BP 120/72 | Ht 69.0 in | Wt 173.0 lb

## 2021-01-09 DIAGNOSIS — M19019 Primary osteoarthritis, unspecified shoulder: Secondary | ICD-10-CM

## 2021-01-09 DIAGNOSIS — M25511 Pain in right shoulder: Secondary | ICD-10-CM

## 2021-01-09 MED ORDER — TRIAMCINOLONE ACETONIDE 40 MG/ML IJ SUSP
40.0000 mg | Freq: Once | INTRAMUSCULAR | Status: AC
  Administered 2021-01-09: 40 mg via INTRA_ARTICULAR

## 2021-01-09 NOTE — Patient Instructions (Signed)
Nice to meet you Please try ice as needed  Please try the exercises   Please send me a message in MyChart with any questions or updates.  Please see me back in 4 weeks.   --Dr. Lavonya Hoerner  

## 2021-01-09 NOTE — Progress Notes (Signed)
Jeffery Simmons - 60 y.o. male MRN [id number]  Date of birth: 1961/05/27  SUBJECTIVE:  Including CC & ROS.  No chief complaint on file.   Jeffery Simmons is a 60 y.o. male that is presenting with acute on chronic bilateral shoulder pain.  Has a history of rotator cuff repair in each shoulder.  The right shoulder is a.  2019 the left shoulder 2020.  Symptoms are exacerbated with his repetitive activities at work.  He does lift and block several things..   Review of Systems See HPI   HISTORY: Past Medical, Surgical, Social, and Family History Reviewed & Updated per EMR.   Pertinent Historical Findings include:  Past Medical History:  Diagnosis Date  . Seizures (HCC)     Past Surgical History:  Procedure Laterality Date  . HEMORRHOID SURGERY    . HEMORRHOID SURGERY    . SHOULDER ARTHROSCOPY WITH ROTATOR CUFF REPAIR Bilateral 2019  . SHOULDER OPEN ROTATOR CUFF REPAIR Bilateral 2019 2020    Family History  Problem Relation Age of Onset  . Stroke Mother     Social History   Socioeconomic History  . Marital status: Married    Spouse name: Not on file  . Number of children: Not on file  . Years of education: Not on file  . Highest education level: Not on file  Occupational History  . Not on file  Tobacco Use  . Smoking status: Current Every Day Smoker    Packs/day: 1.00    Types: Cigarettes  . Smokeless tobacco: Never Used  Vaping Use  . Vaping Use: Never used  Substance and Sexual Activity  . Alcohol use: Never  . Drug use: Never  . Sexual activity: Yes  Other Topics Concern  . Not on file  Social History Narrative  . Not on file   Social Determinants of Health   Financial Resource Strain: Not on file  Food Insecurity: Not on file  Transportation Needs: Not on file  Physical Activity: Not on file  Stress: Not on file  Social Connections: Not on file  Intimate Partner Violence: Not on file     PHYSICAL EXAM:  VS: BP 120/72   Ht 5\' 9"  (1.753 m)   Wt  173 lb (78.5 kg)   BMI 25.55 kg/m  Physical Exam Gen: NAD, alert, cooperative with exam, well-appearing MSK:  Right and left shoulder: Normal range of motion internally.   Some limited external rotation.   Normal strength resistance. Neurovascular intact  Limited ultrasound: Right shoulder, left shoulder:  Right shoulder: Normal-appearing proximal biceps tendon.  There is increased hyperemia surrounding the deltoid and proximal tendon in this area. Normal-appearing supraspinatus with mild bursitis. Large effusion of the [facility] joint and calcification within the joint. No change of the posterior glenohumeral joint.  Left shoulder: No changes of the biceps tendon but significant hyperemia around the proximal biceps tendon and deltoid in this area. Normal-appearing supraspinatus. Mild effusion and degenerative changes of the [facility] joint. Normal-appearing posterior glenohumeral joint.   Summary: Large effusion and degenerative changes of the right AC joint and moderate of the left AC joint.  Ultrasound and interpretation by [facility], MD   Aspiration/Injection Procedure Note Jeffery Simmons March 03, 1961  Procedure: Injection Indications: Right shoulder pain  Procedure Details Consent: Risks of procedure as well as the alternatives and risks of each were explained to the (patient/caregiver).  Consent for procedure obtained. Time Out: Verified patient identification, verified procedure, site/side was marked, verified correct patient position, special equipment/implants available,  medications/allergies/relevent history reviewed, required imaging and test results available.  Performed.  The area was cleaned with iodine and alcohol swabs.    The right AC joint was injected using 1 cc's of 40 mg Kenalog and 1 cc's of 0.25% bupivacaine with a 25 1 1/2" needle.  Ultrasound was used. Images were obtained in short views showing the injection.     A sterile dressing was applied.  Patient  did tolerate procedure well.  Aspiration/Injection Procedure Note Jeffery Simmons [birth year]  Procedure: Injection Indications: Left shoulder pain  Procedure Details Consent: Risks of procedure as well as the alternatives and risks of each were explained to the (patient/caregiver).  Consent for procedure obtained. Time Out: Verified patient identification, verified procedure, site/side was marked, verified correct patient position, special equipment/implants available, medications/allergies/relevent history reviewed, required imaging and test results available.  Performed.  The area was cleaned with iodine and alcohol swabs.    The left AC joint was injected using 1 cc's of 40 mg Kenalog and 1 cc's of 0.25% bupivacaine with a 25 1 1/2" needle.  Ultrasound was used. Images were obtained in short views showing the injection.     A sterile dressing was applied.  Patient did tolerate procedure well.  ASSESSMENT & PLAN:   AC joint arthropathy Appears most of his pain is related more to the [facility] joint.  Does have a history of rotator cuff repair of each shoulder over the past few years.  Rotator cuff itself looks good and moves well.  Does have some lack of external rotation and a fairly concave chest.  Does perform repetitive activities at work. -Counseled on home exercise therapy and supportive care. -Bilateral AC joint injections. -Could consider PRP, topical anti-inflammatories.

## 2021-01-10 DIAGNOSIS — M19019 Primary osteoarthritis, unspecified shoulder: Secondary | ICD-10-CM | POA: Insufficient documentation

## 2021-01-10 NOTE — Assessment & Plan Note (Signed)
Appears most of his pain is related more to the [facility] joint.  Does have a history of rotator cuff repair of each shoulder over the past few years.  Rotator cuff itself looks good and moves well.  Does have some lack of external rotation and a fairly concave chest.  Does perform repetitive activities at work. -Counseled on home exercise therapy and supportive care. -Bilateral AC joint injections. -Could consider PRP, topical anti-inflammatories.

## 2021-01-11 ENCOUNTER — Other Ambulatory Visit: Payer: Medicare Other

## 2021-01-18 ENCOUNTER — Other Ambulatory Visit: Payer: Medicare Other

## 2021-01-29 ENCOUNTER — Other Ambulatory Visit: Payer: Self-pay | Admitting: Family Medicine

## 2021-01-29 DIAGNOSIS — M25519 Pain in unspecified shoulder: Secondary | ICD-10-CM

## 2021-01-29 DIAGNOSIS — Z9889 Other specified postprocedural states: Secondary | ICD-10-CM

## 2021-02-05 ENCOUNTER — Ambulatory Visit: Payer: Medicare Other | Admitting: Diagnostic Neuroimaging

## 2021-02-05 ENCOUNTER — Telehealth: Payer: Self-pay | Admitting: *Deleted

## 2021-02-05 ENCOUNTER — Encounter: Payer: Self-pay | Admitting: Diagnostic Neuroimaging

## 2021-02-05 NOTE — Telephone Encounter (Signed)
Patient was no show for new patient appointment today. 

## 2021-02-07 ENCOUNTER — Ambulatory Visit: Payer: Medicare Other | Admitting: Family Medicine

## 2021-02-07 NOTE — Progress Notes (Deleted)
  Jeffery Simmons - 60 y.o. male MRN [id number]  Date of birth: Jun 29, 1961  SUBJECTIVE:  Including CC & ROS.  No chief complaint on file.   Jeffery Simmons is a 60 y.o. male that is  ***.  ***   Review of Systems See HPI   HISTORY: Past Medical, Surgical, Social, and Family History Reviewed & Updated per EMR.   Pertinent Historical Findings include:  Past Medical History:  Diagnosis Date  . Seizures (HCC)     Past Surgical History:  Procedure Laterality Date  . HEMORRHOID SURGERY    . HEMORRHOID SURGERY    . SHOULDER ARTHROSCOPY WITH ROTATOR CUFF REPAIR Bilateral 2019  . SHOULDER OPEN ROTATOR CUFF REPAIR Bilateral 2019 2020    Family History  Problem Relation Age of Onset  . Stroke Mother     Social History   Socioeconomic History  . Marital status: Married    Spouse name: Not on file  . Number of children: Not on file  . Years of education: Not on file  . Highest education level: Not on file  Occupational History  . Not on file  Tobacco Use  . Smoking status: Current Every Day Smoker    Packs/day: 1.00    Types: Cigarettes  . Smokeless tobacco: Never Used  Vaping Use  . Vaping Use: Never used  Substance and Sexual Activity  . Alcohol use: Never  . Drug use: Never  . Sexual activity: Yes  Other Topics Concern  . Not on file  Social History Narrative  . Not on file   Social Determinants of Health   Financial Resource Strain: Not on file  Food Insecurity: Not on file  Transportation Needs: Not on file  Physical Activity: Not on file  Stress: Not on file  Social Connections: Not on file  Intimate Partner Violence: Not on file     PHYSICAL EXAM:  VS: There were no vitals taken for this visit. Physical Exam Gen: NAD, alert, cooperative with exam, well-appearing MSK:  ***      ASSESSMENT & PLAN:   No problem-specific Assessment & Plan notes found for this encounter.

## 2021-02-12 ENCOUNTER — Ambulatory Visit (INDEPENDENT_AMBULATORY_CARE_PROVIDER_SITE_OTHER): Payer: Medicare Other | Admitting: Family Medicine

## 2021-02-12 ENCOUNTER — Other Ambulatory Visit: Payer: Self-pay

## 2021-02-12 ENCOUNTER — Ambulatory Visit: Payer: Self-pay

## 2021-02-12 ENCOUNTER — Ambulatory Visit: Payer: Medicare Other | Admitting: Family Medicine

## 2021-02-12 ENCOUNTER — Encounter: Payer: Self-pay | Admitting: Family Medicine

## 2021-02-12 VITALS — BP 118/74 | Ht 69.0 in | Wt 173.0 lb

## 2021-02-12 DIAGNOSIS — M7552 Bursitis of left shoulder: Secondary | ICD-10-CM

## 2021-02-12 DIAGNOSIS — M7551 Bursitis of right shoulder: Secondary | ICD-10-CM | POA: Diagnosis not present

## 2021-02-12 MED ORDER — METHYLPREDNISOLONE ACETATE 40 MG/ML IJ SUSP
40.0000 mg | Freq: Once | INTRAMUSCULAR | Status: AC
  Administered 2021-02-12: 40 mg via INTRA_ARTICULAR

## 2021-02-12 NOTE — Progress Notes (Signed)
Medication Samples have been provided to the patient.  Drug name: Pennsaid       Strength: 2%        Qty: 2 boxes LOT: [id number]  Exp.Date: 03/2022  Dosing instructions:  Use a pea sized amount on affected area twice daily  The patient has been instructed regarding the correct time, dose, and frequency of taking this medication, including desired effects and most common side effects.   Lanier Prude 4:41 PM 02/12/2021

## 2021-02-12 NOTE — Progress Notes (Signed)
Jeffery Simmons - 60 y.o. male MRN [id number]  Date of birth: 03/23/1961  SUBJECTIVE:  Including CC & ROS.  No chief complaint on file.   Jeffery Simmons is a 60 y.o. male that is presenting with worsening of his bilateral shoulder pain.  He got minimal improvement with the [facility] joint injections.  His pain is lateral today.   Review of Systems See HPI   HISTORY: Past Medical, Surgical, Social, and Family History Reviewed & Updated per EMR.   Pertinent Historical Findings include:  Past Medical History:  Diagnosis Date  . Seizures (HCC)     Past Surgical History:  Procedure Laterality Date  . HEMORRHOID SURGERY    . HEMORRHOID SURGERY    . SHOULDER ARTHROSCOPY WITH ROTATOR CUFF REPAIR Bilateral 2019  . SHOULDER OPEN ROTATOR CUFF REPAIR Bilateral 2019 2020    Family History  Problem Relation Age of Onset  . Stroke Mother     Social History   Socioeconomic History  . Marital status: Married    Spouse name: Not on file  . Number of children: Not on file  . Years of education: Not on file  . Highest education level: Not on file  Occupational History  . Not on file  Tobacco Use  . Smoking status: Current Every Day Smoker    Packs/day: 1.00    Types: Cigarettes  . Smokeless tobacco: Never Used  Vaping Use  . Vaping Use: Never used  Substance and Sexual Activity  . Alcohol use: Never  . Drug use: Never  . Sexual activity: Yes  Other Topics Concern  . Not on file  Social History Narrative  . Not on file   Social Determinants of Health   Financial Resource Strain: Not on file  Food Insecurity: Not on file  Transportation Needs: Not on file  Physical Activity: Not on file  Stress: Not on file  Social Connections: Not on file  Intimate Partner Violence: Not on file     PHYSICAL EXAM:  VS: BP 118/74 (BP Location: Right Arm, Patient Position: Sitting, Cuff Size: Normal)   Ht 5\' 9"  (1.753 m)   Wt 173 lb (78.5 kg)   BMI 25.55 kg/m  Physical Exam Gen: NAD,  alert, cooperative with exam, well-appearing   Aspiration/Injection Procedure Note [name] 1961/04/28  Procedure: Injection Indications: Right shoulder pain  Procedure Details Consent: Risks of procedure as well as the alternatives and risks of each were explained to the (patient/caregiver).  Consent for procedure obtained. Time Out: Verified patient identification, verified procedure, site/side was marked, verified correct patient position, special equipment/implants available, medications/allergies/relevent history reviewed, required imaging and test results available.  Performed.  The area was cleaned with iodine and alcohol swabs.    The right subacromial space was injected using 1 cc's of 40 mg Depo-Medrol and 4 cc's of 0.25% bupivacaine with a 22 1 1/2" needle.  Ultrasound was used. Images were obtained in long views showing the injection.     A sterile dressing was applied.  Patient did tolerate procedure well.   Aspiration/Injection Procedure Note [name] 06/07/1961  Procedure: Injection Indications: Left shoulder pain  Procedure Details Consent: Risks of procedure as well as the alternatives and risks of each were explained to the (patient/caregiver).  Consent for procedure obtained. Time Out: Verified patient identification, verified procedure, site/side was marked, verified correct patient position, special equipment/implants available, medications/allergies/relevent history reviewed, required imaging and test results available.  Performed.  The area was cleaned with iodine and alcohol  swabs.    The left subacromial space was injected using 1 cc's of 40 mg Depo-Medrol and 4 cc's of 0.25% bupivacaine with a 22 1 1/2" needle.  Ultrasound was used. Images were obtained in long views showing the injection.     A sterile dressing was applied.  Patient did tolerate procedure well.      ASSESSMENT & PLAN:   Subacromial bursitis of both shoulders Does  repetitive activity while at work.  Got minimal improvement with AC joint injections. -Counseled on home exercise therapy and supportive care. -Bilateral injections today. -Provided Pennsaid samples. -Could consider physical therapy.

## 2021-02-12 NOTE — Progress Notes (Deleted)
  Jeffery Simmons - 60 y.o. male MRN [id number]  Date of birth: 06/29/1961  SUBJECTIVE:  Including CC & ROS.  No chief complaint on file.   Jeffery Simmons is a 60 y.o. male that is  ***.  ***   Review of Systems See HPI   HISTORY: Past Medical, Surgical, Social, and Family History Reviewed & Updated per EMR.   Pertinent Historical Findings include:  Past Medical History:  Diagnosis Date  . Seizures (HCC)     Past Surgical History:  Procedure Laterality Date  . HEMORRHOID SURGERY    . HEMORRHOID SURGERY    . SHOULDER ARTHROSCOPY WITH ROTATOR CUFF REPAIR Bilateral 2019  . SHOULDER OPEN ROTATOR CUFF REPAIR Bilateral 2019 2020    Family History  Problem Relation Age of Onset  . Stroke Mother     Social History   Socioeconomic History  . Marital status: Married    Spouse name: Not on file  . Number of children: Not on file  . Years of education: Not on file  . Highest education level: Not on file  Occupational History  . Not on file  Tobacco Use  . Smoking status: Current Every Day Smoker    Packs/day: 1.00    Types: Cigarettes  . Smokeless tobacco: Never Used  Vaping Use  . Vaping Use: Never used  Substance and Sexual Activity  . Alcohol use: Never  . Drug use: Never  . Sexual activity: Yes  Other Topics Concern  . Not on file  Social History Narrative  . Not on file   Social Determinants of Health   Financial Resource Strain: Not on file  Food Insecurity: Not on file  Transportation Needs: Not on file  Physical Activity: Not on file  Stress: Not on file  Social Connections: Not on file  Intimate Partner Violence: Not on file     PHYSICAL EXAM:  VS: There were no vitals taken for this visit. Physical Exam Gen: NAD, alert, cooperative with exam, well-appearing MSK:  ***      ASSESSMENT & PLAN:   No problem-specific Assessment & Plan notes found for this encounter.     

## 2021-02-12 NOTE — Patient Instructions (Signed)
Good to see you Please try ice as needed  Please continue the exercises  Please use the rub on medicine as needed   Please send me a message in MyChart with any questions or updates.  Please see me back in 4 weeks.   --Dr. Jordan Likes

## 2021-02-13 DIAGNOSIS — M7551 Bursitis of right shoulder: Secondary | ICD-10-CM | POA: Insufficient documentation

## 2021-02-13 NOTE — Assessment & Plan Note (Signed)
Does repetitive activity while at work.  Got minimal improvement with AC joint injections. -Counseled on home exercise therapy and supportive care. -Bilateral injections today. -Provided Pennsaid samples. -Could consider physical therapy.

## 2021-03-14 ENCOUNTER — Ambulatory Visit: Payer: Medicare Other | Admitting: Family Medicine

## 2021-03-14 NOTE — Progress Notes (Deleted)
  Jeffery Simmons - 60 y.o. male MRN [id number]  Date of birth: 1961/07/31  SUBJECTIVE:  Including CC & ROS.  No chief complaint on file.   Jeffery Simmons is a 60 y.o. male that is  ***.  ***   Review of Systems See HPI   HISTORY: Past Medical, Surgical, Social, and Family History Reviewed & Updated per EMR.   Pertinent Historical Findings include:  Past Medical History:  Diagnosis Date   Seizures (HCC)     Past Surgical History:  Procedure Laterality Date   HEMORRHOID SURGERY     HEMORRHOID SURGERY     SHOULDER ARTHROSCOPY WITH ROTATOR CUFF REPAIR Bilateral 2019   SHOULDER OPEN ROTATOR CUFF REPAIR Bilateral 2019 2020    Family History  Problem Relation Age of Onset   Stroke Mother     Social History   Socioeconomic History   Marital status: Married    Spouse name: Not on file   Number of children: Not on file   Years of education: Not on file   Highest education level: Not on file  Occupational History   Not on file  Tobacco Use   Smoking status: Every Day    Packs/day: 1.00    Pack years: 0.00    Types: Cigarettes   Smokeless tobacco: Never  Vaping Use   Vaping Use: Never used  Substance and Sexual Activity   Alcohol use: Never   Drug use: Never   Sexual activity: Yes  Other Topics Concern   Not on file  Social History Narrative   Not on file   Social Determinants of Health   Financial Resource Strain: Not on file  Food Insecurity: Not on file  Transportation Needs: Not on file  Physical Activity: Not on file  Stress: Not on file  Social Connections: Not on file  Intimate Partner Violence: Not on file     PHYSICAL EXAM:  VS: There were no vitals taken for this visit. Physical Exam Gen: NAD, alert, cooperative with exam, well-appearing MSK:  ***      ASSESSMENT & PLAN:   No problem-specific Assessment & Plan notes found for this encounter.

## 2021-03-21 ENCOUNTER — Other Ambulatory Visit: Payer: Self-pay

## 2021-03-21 ENCOUNTER — Ambulatory Visit (INDEPENDENT_AMBULATORY_CARE_PROVIDER_SITE_OTHER): Payer: Medicare Other | Admitting: Family Medicine

## 2021-03-21 ENCOUNTER — Encounter: Payer: Self-pay | Admitting: Family Medicine

## 2021-03-21 ENCOUNTER — Ambulatory Visit: Payer: Self-pay

## 2021-03-21 VITALS — BP 130/80 | Ht 69.0 in | Wt 173.0 lb

## 2021-03-21 DIAGNOSIS — M19011 Primary osteoarthritis, right shoulder: Secondary | ICD-10-CM | POA: Diagnosis not present

## 2021-03-21 DIAGNOSIS — M19012 Primary osteoarthritis, left shoulder: Secondary | ICD-10-CM

## 2021-03-21 MED ORDER — TRIAMCINOLONE ACETONIDE 40 MG/ML IJ SUSP
40.0000 mg | Freq: Once | INTRAMUSCULAR | Status: AC
  Administered 2021-03-21: 40 mg via INTRA_ARTICULAR

## 2021-03-21 NOTE — Progress Notes (Signed)
Jeffery Simmons - 60 y.o. male MRN [id number]  Date of birth: 03-08-1961  SUBJECTIVE:  Including CC & ROS.  No chief complaint on file.   Jeffery Simmons is a 60 y.o. male that is presenting with acute worsening of his bilateral shoulder pain.  The pain is more of the round joint.  Did get some improvement with previous injection but pain is severe to where he is dropping things.   Review of Systems See HPI   HISTORY: Past Medical, Surgical, Social, and Family History Reviewed & Updated per EMR.   Pertinent Historical Findings include:  Past Medical History:  Diagnosis Date   Seizures (HCC)     Past Surgical History:  Procedure Laterality Date   HEMORRHOID SURGERY     HEMORRHOID SURGERY     SHOULDER ARTHROSCOPY WITH ROTATOR CUFF REPAIR Bilateral 2019   SHOULDER OPEN ROTATOR CUFF REPAIR Bilateral 2019 2020    Family History  Problem Relation Age of Onset   Stroke Mother     Social History   Socioeconomic History   Marital status: Married    Spouse name: Not on file   Number of children: Not on file   Years of education: Not on file   Highest education level: Not on file  Occupational History   Not on file  Tobacco Use   Smoking status: Every Day    Packs/day: 1.00    Types: Cigarettes   Smokeless tobacco: Never  Vaping Use   Vaping Use: Never used  Substance and Sexual Activity   Alcohol use: Never   Drug use: Never   Sexual activity: Yes  Other Topics Concern   Not on file  Social History Narrative   Not on file   Social Determinants of Health   Financial Resource Strain: Not on file  Food Insecurity: Not on file  Transportation Needs: Not on file  Physical Activity: Not on file  Stress: Not on file  Social Connections: Not on file  Intimate Partner Violence: Not on file     PHYSICAL EXAM:  VS: BP 130/80 (BP Location: Right Arm, Patient Position: Sitting, Cuff Size: Normal)   Ht 5\' 9"  (1.753 m)   Wt 173 lb (78.5 kg)   BMI 25.55 kg/m   Physical Exam Gen: NAD, alert, cooperative with exam, well-appearing    Aspiration/Injection Procedure Note Chrsitopher Wik [birth year]  Procedure: Injection Indications: Right shoulder pain  Procedure Details Consent: Risks of procedure as well as the alternatives and risks of each were explained to the (patient/caregiver).  Consent for procedure obtained. Time Out: Verified patient identification, verified procedure, site/side was marked, verified correct patient position, special equipment/implants available, medications/allergies/relevent history reviewed, required imaging and test results available.  Performed.  The area was cleaned with iodine and alcohol swabs.    The right glenohumeral space was injected using 1 cc's of 40 mg Kenalog and 4 cc's of 0.25% bupivacaine with a 22 3 1/2" needle.  Ultrasound was used. Images were obtained in short views showing the injection.     A sterile dressing was applied.  Patient did tolerate procedure well.  Aspiration/Injection Procedure Note Kees Idrovo July 02, 1961  Procedure: Injection Indications: left shoulder pain  Procedure Details Consent: Risks of procedure as well as the alternatives and risks of each were explained to the (patient/caregiver).  Consent for procedure obtained. Time Out: Verified patient identification, verified procedure, site/side was marked, verified correct patient position, special equipment/implants available, medications/allergies/relevent history reviewed, required imaging and test results available.  Performed.  The area was cleaned with iodine and alcohol swabs.    The left glenohumeral space was injected using 1 cc's of 40 mg Kenalog and 4 cc's of 0.25% bupivacaine with a 22 3 1/2" needle.  Ultrasound was used. Images were obtained in short views showing the injection.     A sterile dressing was applied.  Patient did tolerate procedure well.    ASSESSMENT & PLAN:   Primary osteoarthritis of  both shoulders Acute on chronic in nature.  Symptoms seem more consistent with a joint space today. -Counseled on home exercise therapy and supportive care. -Bilateral injections today. -Could consider PRP or physical therapy.

## 2021-03-21 NOTE — Patient Instructions (Signed)
Good to see you Please try ice   Please send me a message in MyChart with any questions or updates.  Please see me back in 6-8 weeks.   --Dr. Jordan Likes

## 2021-03-21 NOTE — Assessment & Plan Note (Signed)
Acute on chronic in nature.  Symptoms seem more consistent with a joint space today. -Counseled on home exercise therapy and supportive care. -Bilateral injections today. -Could consider PRP or physical therapy.

## 2021-03-27 ENCOUNTER — Telehealth: Payer: Self-pay | Admitting: Family Medicine

## 2021-03-27 NOTE — Telephone Encounter (Signed)
Pt's wife called states he has had a poss (Allergic reaction ) to the injection gvn 03/21/21 @ [facility]  --Rash & Whelps on Chest, Arms & thighs since Thursday .  -- They request provider call w/ advice on what to do & to check ingredients for Tylenol (it maybe what's causing the rash).   Pt is at work but can be reached @ [phone] --goes to break @ 12:30 per wife.  --glh

## 2021-03-27 NOTE — Telephone Encounter (Signed)
Less likely that his symptoms are related to steroid injection. May have other exposure. Counseled on supportive care and advised for follow up if symptoms persist.   Myra Rude, MD [facility] 03/27/2021, 1:13 PM

## 2021-04-06 ENCOUNTER — Other Ambulatory Visit: Payer: Self-pay

## 2021-04-06 ENCOUNTER — Encounter (HOSPITAL_BASED_OUTPATIENT_CLINIC_OR_DEPARTMENT_OTHER): Payer: Self-pay

## 2021-04-06 ENCOUNTER — Emergency Department (HOSPITAL_BASED_OUTPATIENT_CLINIC_OR_DEPARTMENT_OTHER): Payer: Medicare Other

## 2021-04-06 ENCOUNTER — Emergency Department (HOSPITAL_BASED_OUTPATIENT_CLINIC_OR_DEPARTMENT_OTHER)
Admission: EM | Admit: 2021-04-06 | Discharge: 2021-04-06 | Disposition: A | Discharge status: Home / Self Care | Payer: Medicare Other | Attending: Emergency Medicine | Admitting: Emergency Medicine

## 2021-04-06 DIAGNOSIS — N201 Calculus of ureter: Secondary | ICD-10-CM

## 2021-04-06 DIAGNOSIS — K5900 Constipation, unspecified: Secondary | ICD-10-CM | POA: Diagnosis not present

## 2021-04-06 DIAGNOSIS — F17210 Nicotine dependence, cigarettes, uncomplicated: Secondary | ICD-10-CM | POA: Insufficient documentation

## 2021-04-06 DIAGNOSIS — R109 Unspecified abdominal pain: Secondary | ICD-10-CM | POA: Diagnosis present

## 2021-04-06 DIAGNOSIS — N132 Hydronephrosis with renal and ureteral calculous obstruction: Secondary | ICD-10-CM | POA: Insufficient documentation

## 2021-04-06 LAB — COMPREHENSIVE METABOLIC PANEL
ALT: 26 U/L (ref 0–44)
AST: 27 U/L (ref 15–41)
Albumin: 4.2 g/dL (ref 3.5–5.0)
Alkaline Phosphatase: 63 U/L (ref 38–126)
Anion gap: 9 (ref 5–15)
BUN: 25 mg/dL — ABNORMAL HIGH (ref 6–20)
CO2: 25 mmol/L (ref 22–32)
Calcium: 9.2 mg/dL (ref 8.9–10.3)
Chloride: 105 mmol/L (ref 98–111)
Creatinine, Ser: 1.43 mg/dL — ABNORMAL HIGH (ref 0.61–1.24)
GFR, Estimated: 56 mL/min — ABNORMAL LOW (ref >60)
Glucose, Bld: 159 mg/dL — ABNORMAL HIGH (ref 70–99)
Potassium: 3.5 mmol/L (ref 3.5–5.1)
Sodium: 139 mmol/L (ref 135–145)
Total Bilirubin: 1.2 mg/dL (ref 0.3–1.2)
Total Protein: 7.7 g/dL (ref 6.5–8.1)

## 2021-04-06 LAB — URINALYSIS, ROUTINE W REFLEX MICROSCOPIC
Bilirubin Urine: NEGATIVE
Glucose, UA: NEGATIVE mg/dL
Ketones, ur: NEGATIVE mg/dL
Leukocytes,Ua: NEGATIVE
Nitrite: NEGATIVE
Protein, ur: NEGATIVE mg/dL
Specific Gravity, Urine: >1.03 — ABNORMAL HIGH (ref 1.005–1.030)
pH: 5 (ref 5.0–8.0)

## 2021-04-06 LAB — CBC WITH DIFFERENTIAL/PLATELET
Abs Immature Granulocytes: 0.04 10*3/uL (ref 0.00–0.07)
Basophils Absolute: 0 10*3/uL (ref 0.0–0.1)
Basophils Relative: 0 %
Eosinophils Absolute: 0.1 10*3/uL (ref 0.0–0.5)
Eosinophils Relative: 1 %
HCT: 47.7 % (ref 39.0–52.0)
Hemoglobin: 15.6 g/dL (ref 13.0–17.0)
Immature Granulocytes: 1 %
Lymphocytes Relative: 22 %
Lymphs Abs: 1.7 10*3/uL (ref 0.7–4.0)
MCH: 30.3 pg (ref 26.0–34.0)
MCHC: 32.7 g/dL (ref 30.0–36.0)
MCV: 92.6 fL (ref 80.0–100.0)
Monocytes Absolute: 0.7 10*3/uL (ref 0.1–1.0)
Monocytes Relative: 9 %
Neutro Abs: 5.4 10*3/uL (ref 1.7–7.7)
Neutrophils Relative %: 67 %
Platelets: 123 10*3/uL — ABNORMAL LOW (ref 150–400)
RBC: 5.15 MIL/uL (ref 4.22–5.81)
RDW: 15.7 % — ABNORMAL HIGH (ref 11.5–15.5)
WBC: 7.9 10*3/uL (ref 4.0–10.5)
nRBC: 0 % (ref 0.0–0.2)

## 2021-04-06 LAB — URINALYSIS, MICROSCOPIC (REFLEX)

## 2021-04-06 MED ORDER — TAMSULOSIN HCL 0.4 MG PO CAPS: 0.4000 mg | ORAL_CAPSULE | Freq: Every day | ORAL | 0 refills | Status: DC

## 2021-04-06 MED ORDER — ONDANSETRON 4 MG PO TBDP: 4.0000 mg | ORAL_TABLET | Freq: Three times a day (TID) | ORAL | 0 refills | Status: AC | PRN

## 2021-04-06 MED ORDER — OXYCODONE HCL 5 MG PO TABS: 5.0000 mg | ORAL_TABLET | Freq: Four times a day (QID) | ORAL | 0 refills | Status: DC | PRN

## 2021-04-06 MED ORDER — KETOROLAC TROMETHAMINE 15 MG/ML IJ SOLN
15.0000 mg | Freq: Once | INTRAMUSCULAR | Status: AC
  Administered 2021-04-06: 15 mg via INTRAVENOUS
  Filled 2021-04-06: qty 1

## 2021-04-06 MED ORDER — SODIUM CHLORIDE 0.9 % IV BOLUS
1000.0000 mL | Freq: Once | INTRAVENOUS | Status: AC
  Administered 2021-04-06: 1000 mL via INTRAVENOUS

## 2021-04-06 MED ORDER — NAPROXEN 500 MG PO TABS: 500.0000 mg | ORAL_TABLET | Freq: Two times a day (BID) | ORAL | 0 refills | Status: DC

## 2021-04-06 NOTE — ED Provider Notes (Signed)
[facility] EMERGENCY DEPARTMENT Provider Note   CSN: [id number] Arrival date & time: 04/06/21  1204     History Chief Complaint  Patient presents with   Abdominal Pain    Jeffery Simmons is a 60 y.o. male.  Patient with no history of abdominal surgeries presents the emergency department today for evaluation of vomiting, constipation.  States that he went to an urgent care and had x-rays done and they were concerned about a bowel obstruction or potentially kidney stones.  Patient states that he has had multiple episodes of vomiting.  No chest pain, shortness of breath, fevers or cough.  No diarrhea.  No urinary symptoms.  Pain in the abdomen is worse in the left upper quadrant.  No radiation.  No treatments prior to arrival.      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   Healthcare maintenance 12/31/2020   Androgen deficiency 12/31/2020   Seizure disorder (HCC) 05/15/2020   Tobacco use 05/15/2020    Past Surgical History:  Procedure Laterality Date   HEMORRHOID SURGERY     HEMORRHOID SURGERY     SHOULDER ARTHROSCOPY WITH ROTATOR CUFF REPAIR Bilateral 2019   SHOULDER OPEN ROTATOR CUFF REPAIR Bilateral 2019 2020       Family History  Problem Relation Age of Onset   Stroke Mother     Social History   Tobacco Use   Smoking status: Every Day    Packs/day: 1.00    Types: Cigarettes   Smokeless tobacco: Never  Vaping Use   Vaping Use: Never used  Substance Use Topics   Alcohol use: Never   Drug use: Never    Home Medications Prior to Admission medications   Medication Sig Start Date End Date Taking? Authorizing Provider  levETIRAcetam (KEPPRA) 250 MG tablet Take 250 mg by mouth 2 (two) times daily.    [provider]  meloxicam  (MOBIC) 7.5 MG tablet One daily as needed 01/29/21   Mliss Sax, MD    Allergies    Tylenol [acetaminophen]  Review of Systems   Review of Systems  Constitutional:  Negative for fever.  HENT:  Negative for rhinorrhea and sore throat.   Eyes:  Negative for redness.  Respiratory:  Negative for cough.   Cardiovascular:  Negative for chest pain.  Gastrointestinal:  Positive for abdominal pain, constipation, nausea and vomiting. Negative for diarrhea.  Genitourinary:  Negative for dysuria and hematuria.  Musculoskeletal:  Negative for myalgias.  Skin:  Negative for rash.  Neurological:  Negative for headaches.   Physical Exam Updated Vital Signs BP (!) 139/96 (BP Location: Left Arm)   Pulse (!) 56   Temp 98.5 F (36.9 C) (Oral)   Resp 16   Ht 5\' 9"  (1.753 m)   Wt 75.3 kg   SpO2 100%   BMI 24.51 kg/m   Physical Exam Vitals and nursing note reviewed.  Constitutional:      General: He is not in acute distress.    Appearance: He is well-developed.  HENT:     Head: Normocephalic and atraumatic.  Eyes:     General:        Right eye: No discharge.        Left eye: No discharge.     Conjunctiva/sclera: Conjunctivae  normal.  Cardiovascular:     Rate and Rhythm: Normal rate and regular rhythm.     Heart sounds: Normal heart sounds.  Pulmonary:     Effort: Pulmonary effort is normal.     Breath sounds: Normal breath sounds.  Abdominal:     Palpations: Abdomen is soft.     Tenderness: There is abdominal tenderness (Mild left-sided abdominal tenderness, worse upper) in the left upper quadrant and left lower quadrant. There is no guarding or rebound.  Musculoskeletal:     Cervical back: Normal range of motion and neck supple.  Skin:    General: Skin is warm and dry.  Neurological:     Mental Status: He is alert.    ED Results / Procedures / Treatments   Labs (all labs ordered are listed, but only abnormal results are displayed) Labs Reviewed  CBC WITH  DIFFERENTIAL/PLATELET - Abnormal; Notable for the following components:      Result Value   RDW 15.7 (*)    Platelets 123 (*)    All other components within normal limits  COMPREHENSIVE METABOLIC PANEL - Abnormal; Notable for the following components:   Glucose, Bld 159 (*)    BUN 25 (*)    Creatinine, Ser 1.43 (*)    GFR, Estimated 56 (*)    All other components within normal limits  URINALYSIS, ROUTINE W REFLEX MICROSCOPIC - Abnormal; Notable for the following components:   Specific Gravity, Urine >1.030 (*)    Hgb urine dipstick MODERATE (*)    All other components within normal limits  URINALYSIS, MICROSCOPIC (REFLEX) - Abnormal; Notable for the following components:   Bacteria, UA FEW (*)    All other components within normal limits    EKG None  Radiology CT RENAL STONE STUDY  Result Date: 04/06/2021 CLINICAL DATA:  Left flank pain, hematuria.  Is is EXAM: CT ABDOMEN AND PELVIS WITHOUT CONTRAST TECHNIQUE: Multidetector CT imaging of the abdomen and pelvis was performed following the standard protocol without IV contrast. COMPARISON:  None. FINDINGS: Lower chest: No acute abnormality. Hepatobiliary: No focal liver abnormality is seen. Normal appearance of the gallbladder. Pancreas: Unremarkable. No surrounding inflammatory changes. Spleen: Normal in size without focal abnormality. Adrenals/Urinary Tract: Adrenal glands are unremarkable. There are a few punctate right renal calculi. No hydronephrosis. There is moderate left hydronephrosis secondary to a 7 mm calculus in the proximal left ureter. There is an additional small calculus in the left kidney. Urinary bladder is unremarkable. Stomach/Bowel: Stomach is within normal limits. Appendix not well visualized. No evidence of bowel wall thickening, distention, or inflammatory changes. Vascular/Lymphatic: Aortic atherosclerosis. Vascular patency cannot be assessed in the absence of IV contrast. Post no enlarged abdominal or pelvic lymph  nodes. Reproductive: Prostate is borderline enlarged. Other: No abdominal wall hernia or abnormality. No abdominopelvic ascites. Musculoskeletal: No acute or significant osseous findings. IMPRESSION: 1. Moderate left hydronephrosis secondary to a 7 mm calculus in the proximal left ureter. 2. Nonobstructing right renal calculi. 3. Aortic atherosclerosis. Aortic Atherosclerosis (ICD10-I70.0). Electronically Signed   By: Emmaline Kluver M.D.   On: 04/06/2021 14:29    Procedures Procedures   Medications Ordered in ED Medications  ketorolac (TORADOL) 15 MG/ML injection 15 mg (15 mg Intravenous Given 04/06/21 1507)  sodium chloride 0.9 % bolus 1,000 mL (0 mLs Intravenous Stopped 04/06/21 1603)    ED Course  I have reviewed the triage vital signs and the nursing notes.  Pertinent labs & imaging results that were available during my care of  the patient were reviewed by me and considered in my medical decision making (see chart for details).  Patient seen and examined. Work-up initiated. Medications ordered.   Vital signs reviewed and are as follows: BP (!) 139/96 (BP Location: Left Arm)   Pulse (!) 56   Temp 98.5 F (36.9 C) (Oral)   Resp 16   Ht 5\' 9"  (1.753 m)   Wt 75.3 kg   SpO2 100%   BMI 24.51 kg/m   2:51 PM CT shows L proximal ureteral stone. This is likely cause of symptoms. Pending UA. Creatinine is marginally elevated and urine is concentrated-- will give IV fluids.   4:55 PM patient and wife at bedside updated on results.  Patient is comfortable.  Discussed treatment plan and need for follow-up with urology.  Patient counseled on kidney stone treatment. Urged patient to strain urine and save any stones. Urged urology follow-up and return to [facility] with any complications. Counseled patient to maintain good fluid intake.   Counseled patient on use of Flomax.   Patient counseled on use of narcotic pain medications. Counseled not to combine these medications with others containing  tylenol. Urged not to drink alcohol, drive, or perform any other activities that requires focus while taking these medications. The patient verbalizes understanding and agrees with the plan.    MDM Rules/Calculators/A&P                           Patient with abdominal pain.  Found to have 7 mm proximal left-sided ureteral stone on CT imaging.  No signs of pyelonephritis.  Mildly elevated creatinine, treated with IV fluids.  Pain and nausea controlled.  Patient will need urology follow-up.  This can be done as outpatient.  Home with treatments as above.  Encouraged return with worsening and he seems reliable to do so.    Final Clinical Impression(s) / ED Diagnoses Final diagnoses:  Left ureteral stone    Rx / DC Orders ED Discharge Orders          Ordered    oxyCODONE (OXY IR/ROXICODONE) 5 MG immediate release tablet  Every 6 hours PRN        04/06/21 1651    ondansetron (ZOFRAN ODT) 4 MG disintegrating tablet  Every 8 hours PRN        04/06/21 1651    naproxen (NAPROSYN) 500 MG tablet  2 times daily        04/06/21 1651    tamsulosin (FLOMAX) 0.4 MG CAPS capsule  Daily        04/06/21 1651             04/08/21, PA-C 04/06/21 1657    04/08/21, MD 04/07/21 [phone]

## 2021-04-06 NOTE — Discharge Instructions (Signed)
Please read and follow all provided instructions.  Your diagnoses today include:  1. Left ureteral stone     Tests performed today include: Urine test that showed blood in your urine and no infection CT scan which showed a 7 millimeter kidney on the left side Blood test that showed slightly weak kidney function Vital signs. See below for your results today.   Medications prescribed:  Zofran (ondansetron) - for nausea and vomiting  Naproxen - anti-inflammatory pain medication Do not exceed 500mg  naproxen every 12 hours, take with food  You have been prescribed an anti-inflammatory medication or NSAID. Take with food. Take smallest effective dose for the shortest duration needed for your pain. Stop taking if you experience stomach pain or vomiting.   Flomax (tamsulosin) - relaxes smooth muscle to help kidney stones pass  Oxycodone - narcotic pain medication  DO NOT drive or perform any activities that require you to be awake and alert because this medicine can make you drowsy.   Take any prescribed medications only as directed.  Home care instructions:  Follow any educational materials contained in this packet.  Please double your fluid intake for the next several days. Strain your urine and save any stones that may pass.   BE VERY CAREFUL not to take multiple medicines containing Tylenol (also called acetaminophen). Doing so can lead to an overdose which can damage your liver and cause liver failure and possibly death.   Follow-up instructions: Please follow-up with your urologist or the urologist referral (provided on front page) in the next 1 week for further evaluation of your symptoms.  Return instructions:  If you need to return to the Emergency Department, go to [facility] and not [facility]. The urologists are located at [facility] and can better care for you at this location.  Please return to the Emergency Department if you experience worsening  symptoms.  Please return if you develop fever or uncontrolled pain or vomiting. Please return if you have any other emergent concerns.  Additional Information:  Your vital signs today were: BP (!) 173/93 (BP Location: Right Arm)   Pulse (!) 52   Temp 98 F (36.7 C) (Oral)   Resp 18   Ht 5\' 9"  (1.753 m)   Wt 75.3 kg   SpO2 100%   BMI 24.51 kg/m  If your blood pressure (BP) was elevated above 135/85 this visit, please have this repeated by your doctor within one month. --------------

## 2021-04-06 NOTE — ED Notes (Signed)
ED Provider at bedside. Josh Geiple, PA 

## 2021-04-06 NOTE — ED Triage Notes (Signed)
Pt sent from urgent care, had a Xray and states that MD wanted CT scan due to possible kidney stones or obstruction. Pt C/O blood in urine, N/V for 3 days and last BM was over 2 weeks ago. Xray in hand. ABD pain RUQ, denies back pain.

## 2021-04-08 ENCOUNTER — Inpatient Hospital Stay (HOSPITAL_COMMUNITY): Payer: Medicare Other

## 2021-04-08 ENCOUNTER — Ambulatory Visit (HOSPITAL_COMMUNITY)
Admission: RE | Admit: 2021-04-08 | Discharge: 2021-04-08 | Disposition: A | Discharge status: Home / Self Care | Payer: Medicare Other | Source: Ambulatory Visit | Attending: Urology | Admitting: Urology

## 2021-04-08 ENCOUNTER — Inpatient Hospital Stay (HOSPITAL_COMMUNITY): Payer: Medicare Other | Admitting: Anesthesiology

## 2021-04-08 ENCOUNTER — Other Ambulatory Visit: Payer: Self-pay | Admitting: Urology

## 2021-04-08 ENCOUNTER — Encounter (HOSPITAL_COMMUNITY)
Admission: RE | Disposition: A | Discharge status: Home / Self Care | Payer: Self-pay | Source: Ambulatory Visit | Attending: Urology

## 2021-04-08 ENCOUNTER — Encounter (HOSPITAL_COMMUNITY): Payer: Self-pay | Admitting: Urology

## 2021-04-08 DIAGNOSIS — F17200 Nicotine dependence, unspecified, uncomplicated: Secondary | ICD-10-CM | POA: Insufficient documentation

## 2021-04-08 DIAGNOSIS — Z79899 Other long term (current) drug therapy: Secondary | ICD-10-CM | POA: Diagnosis not present

## 2021-04-08 DIAGNOSIS — Z886 Allergy status to analgesic agent status: Secondary | ICD-10-CM | POA: Insufficient documentation

## 2021-04-08 DIAGNOSIS — Z841 Family history of disorders of kidney and ureter: Secondary | ICD-10-CM | POA: Diagnosis not present

## 2021-04-08 DIAGNOSIS — N201 Calculus of ureter: Secondary | ICD-10-CM | POA: Insufficient documentation

## 2021-04-08 HISTORY — PX: CYSTOSCOPY/URETEROSCOPY/HOLMIUM LASER/STENT PLACEMENT: SHX6546

## 2021-04-08 SURGERY — CYSTOSCOPY/URETEROSCOPY/HOLMIUM LASER/STENT PLACEMENT
Anesthesia: General | Laterality: Left

## 2021-04-08 MED ORDER — FENTANYL CITRATE (PF) 100 MCG/2ML IJ SOLN
INTRAMUSCULAR | Status: DC | PRN
  Administered 2021-04-08 (×2): 100 ug via INTRAVENOUS

## 2021-04-08 MED ORDER — FENTANYL CITRATE (PF) 100 MCG/2ML IJ SOLN
INTRAMUSCULAR | Status: AC
  Filled 2021-04-08: qty 2

## 2021-04-08 MED ORDER — SODIUM CHLORIDE 0.9 % IV SOLN
2.0000 g | INTRAVENOUS | Status: DC
  Administered 2021-04-08: 2 g via INTRAVENOUS
  Filled 2021-04-08: qty 2

## 2021-04-08 MED ORDER — PROPOFOL 10 MG/ML IV BOLUS
INTRAVENOUS | Status: AC
  Filled 2021-04-08: qty 40

## 2021-04-08 MED ORDER — LACTATED RINGERS IV SOLN: INTRAVENOUS | Status: DC

## 2021-04-08 MED ORDER — DEXAMETHASONE SODIUM PHOSPHATE 10 MG/ML IJ SOLN
INTRAMUSCULAR | Status: DC | PRN
  Administered 2021-04-08: 5 mg via INTRAVENOUS

## 2021-04-08 MED ORDER — FENTANYL CITRATE (PF) 100 MCG/2ML IJ SOLN: 25.0000 ug | INTRAMUSCULAR | Status: DC | PRN

## 2021-04-08 MED ORDER — SUCCINYLCHOLINE CHLORIDE 200 MG/10ML IV SOSY
PREFILLED_SYRINGE | INTRAVENOUS | Status: DC | PRN
  Administered 2021-04-08: 100 mg via INTRAVENOUS

## 2021-04-08 MED ORDER — SODIUM CHLORIDE 0.9 % IR SOLN
Status: DC | PRN
  Administered 2021-04-08: 3000 mL via INTRAVESICAL

## 2021-04-08 MED ORDER — ROCURONIUM BROMIDE 10 MG/ML (PF) SYRINGE
PREFILLED_SYRINGE | INTRAVENOUS | Status: DC | PRN
  Administered 2021-04-08: 20 mg via INTRAVENOUS

## 2021-04-08 MED ORDER — MIDAZOLAM HCL 2 MG/2ML IJ SOLN
INTRAMUSCULAR | Status: DC | PRN
  Administered 2021-04-08: 2 mg via INTRAVENOUS

## 2021-04-08 MED ORDER — MEPERIDINE HCL 50 MG/ML IJ SOLN: 6.2500 mg | INTRAMUSCULAR | Status: DC | PRN

## 2021-04-08 MED ORDER — CEFDINIR 300 MG PO CAPS: 300.0000 mg | ORAL_CAPSULE | Freq: Two times a day (BID) | ORAL | 0 refills | Status: AC

## 2021-04-08 MED ORDER — SUGAMMADEX SODIUM 200 MG/2ML IV SOLN
INTRAVENOUS | Status: DC | PRN
  Administered 2021-04-08: 200 mg via INTRAVENOUS

## 2021-04-08 MED ORDER — IOHEXOL 300 MG/ML  SOLN
INTRAMUSCULAR | Status: DC | PRN
  Administered 2021-04-08: 10 mL

## 2021-04-08 MED ORDER — PROMETHAZINE HCL 25 MG/ML IJ SOLN: 6.2500 mg | INTRAMUSCULAR | Status: DC | PRN

## 2021-04-08 MED ORDER — MIDAZOLAM HCL 2 MG/2ML IJ SOLN
INTRAMUSCULAR | Status: AC
  Filled 2021-04-08: qty 2

## 2021-04-08 MED ORDER — PROPOFOL 10 MG/ML IV BOLUS
INTRAVENOUS | Status: DC | PRN
  Administered 2021-04-08: 130 mg via INTRAVENOUS

## 2021-04-08 MED ORDER — LIDOCAINE HCL (CARDIAC) PF 100 MG/5ML IV SOSY
PREFILLED_SYRINGE | INTRAVENOUS | Status: DC | PRN
Start: 2021-04-08 — End: 2021-04-08
  Administered 2021-04-08: 60 mg via INTRAVENOUS

## 2021-04-08 MED ORDER — ONDANSETRON HCL 4 MG/2ML IJ SOLN
INTRAMUSCULAR | Status: DC | PRN
  Administered 2021-04-08: 4 mg via INTRAVENOUS

## 2021-04-08 MED ORDER — CEFAZOLIN SODIUM-DEXTROSE 2-4 GM/100ML-% IV SOLN
2.0000 g | Freq: Once | INTRAVENOUS | Status: DC
  Filled 2021-04-08: qty 100

## 2021-04-08 SURGICAL SUPPLY — 25 items
BAG URO CATCHER STRL LF (MISCELLANEOUS) ×2 IMPLANT
BASKET STONE NCOMPASS (UROLOGICAL SUPPLIES) IMPLANT
CATH URET 5FR 28IN OPEN ENDED (CATHETERS) ×2 IMPLANT
CATH URET DUAL LUMEN 6-10FR 50 (CATHETERS) IMPLANT
CLOTH BEACON ORANGE TIMEOUT ST (SAFETY) ×2 IMPLANT
EXTRACTOR STONE NITINOL NGAGE (UROLOGICAL SUPPLIES) ×2 IMPLANT
FIBER LASER MOSES 200 DFL (Laser) ×2 IMPLANT
GLOVE SURG POLYISO LF SZ8 (GLOVE) ×2 IMPLANT
GOWN STRL REUS W/TWL XL LVL3 (GOWN DISPOSABLE) ×2 IMPLANT
GUIDEWIRE STR DUAL SENSOR (WIRE) ×2 IMPLANT
IV NS IRRIG 3000ML ARTHROMATIC (IV SOLUTION) ×2 IMPLANT
KIT TURNOVER KIT A (KITS) ×2 IMPLANT
LASER FIB FLEXIVA PULSE ID 365 (Laser) IMPLANT
LASER FIB FLEXIVA PULSE ID 550 (Laser) IMPLANT
LASER FIB FLEXIVA PULSE ID 910 (Laser) IMPLANT
MANIFOLD NEPTUNE II (INSTRUMENTS) ×2 IMPLANT
OPEN ENDED URETHRAL CATHETER ×2 IMPLANT
PACK CYSTO (CUSTOM PROCEDURE TRAY) ×2 IMPLANT
SHEATH URETERAL 12FRX35CM (MISCELLANEOUS) ×2 IMPLANT
SHEATH URETERAL 12FRX55CM (UROLOGICAL SUPPLIES) ×2 IMPLANT
STENT URET 6FRX26 CONTOUR (STENTS) ×2 IMPLANT
TRACTIP FLEXIVA PULS ID 200XHI (Laser) IMPLANT
TRACTIP FLEXIVA PULSE ID 200 (Laser)
TUBING CONNECTING 10 (TUBING) ×2 IMPLANT
TUBING UROLOGY SET (TUBING) ×2 IMPLANT

## 2021-04-08 NOTE — H&P (Signed)
I have ureteral stone.  HPI: Jeffery Simmons is a 60 year-old male patient who is here for ureteral stone.    Jeffery Simmons is a 60 yo male who was seen in the ER on 7/30 with left flank pain with severe N&V. he was found to have a 38mm left proximal stone on CT with obstruction. The max density was 1400HU. He had a small LUP stone as well. He has no prior GU history. His Cr was up to 1.43 in the ER from 0.95 last year. He has a family history of stones. He continues to have pain. He has been constipation. He has had some hematuria. He has no urgency.      ALLERGIES: Tylenol    MEDICATIONS: Tamsulosin Hcl 0.4 mg capsule  Naproxen  Ondansetron Hcl 4 mg tablet  Oxycodone Hcl 5 mg capsule     GU PSH: None   NON-GU PSH: Shoulder Surgery (Unspecified), Left - 04/08/2021, Right - 2019     GU PMH: None   NON-GU PMH: Other seizures    FAMILY HISTORY: Family History Unknown    SOCIAL HISTORY: Marital Status: Unknown Preferred Language: English; Ethnicity: Not Hispanic Or Latino; Race: Black or African American Current Smoking Status: Patient smokes occasionally.   Tobacco Use Assessment Completed: Used Tobacco in last 30 days? Does not use smokeless tobacco. Has never drank.  Does not use drugs. Drinks 1 caffeinated drink per day. 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  Gastrointestinal (Upper):   Patient reports vomiting. Patient denies nausea and indigestion/ heartburn.  Gastrointestinal (Lower):   Patient denies diarrhea and constipation.  Constitutional:   Patient denies fever, night sweats, weight loss, and fatigue.  Skin:   Patient denies skin rash/ lesion and itching.   Eyes:   Patient denies blurred vision and double vision.  Ears/ Nose/ Throat:   Patient denies sore throat and sinus problems.  Hematologic/Lymphatic:   Patient denies swollen glands and easy bruising.  Cardiovascular:   Patient denies leg swelling and chest pains.  Respiratory:   Patient denies cough and shortness of breath.  Endocrine:   Patient denies excessive thirst.  Musculoskeletal:   Patient denies back pain and joint pain.  Neurological:   Patient denies headaches and dizziness.  Psychologic:   Patient denies depression and anxiety.   Notes: Blood in urine    VITAL SIGNS:      04/08/2021 02:58 PM  Weight 164 lb / 74.39 kg  Height 69 in / 175.26 cm  BP 110/65 mmHg  Pulse 56 /min  Temperature 98.4 F / 36.8 C  BMI 24.2 kg/m   MULTI-SYSTEM PHYSICAL EXAMINATION:    Constitutional: Well-nourished. No physical deformities. Normally developed. Good grooming.  Neck: Neck symmetrical, not swollen. Normal tracheal position.  Respiratory: Normal breath sounds. No labored breathing, no use of accessory muscles.   Cardiovascular: Regular rate and rhythm. No murmur, no gallop.   Skin: No paleness, no jaundice, no cyanosis. No lesion, no ulcer, no rash.  Neurologic / Psychiatric: Oriented to time, oriented to place, oriented to person. No depression, no anxiety, no agitation.  Gastrointestinal: Abdominal tenderness, mild left CVAT. No mass, no rigidity, non obese abdomen.   Musculoskeletal: Normal  gait and station of head and neck.     Complexity of Data:  Lab Test Review:   BMP  Records Review:   Previous Hospital Records  Urine Test Review:   Urinalysis  X-Ray Review: [facility] Protocol: Reviewed Films. Reviewed Report. Discussed With Patient.     PROCEDURES:          Urinalysis w/Scope Dipstick Dipstick Cont'd Micro  Color: Yellow Bilirubin: Neg mg/dL WBC/hpf: 0 - 5/hpf  Appearance: Slightly Cloudy Ketones: Neg mg/dL RBC/hpf: >70/IKW  Specific Gravity: 1.025 Blood: 3+  ery/uL Bacteria: NS (Not Seen)  pH: 5.5 Protein: Trace mg/dL Cystals: NS (Not Seen)  Glucose: Neg mg/dL Urobilinogen: 0.2 mg/dL Casts: NS (Not Seen)    Nitrites: Neg Trichomonas: Not Present    Leukocyte Esterase: Neg leu/uL Mucous: Present      Epithelial Cells: NS (Not Seen)      Yeast: NS (Not Seen)      Sperm: Not Present    ASSESSMENT:      ICD-10 Details  1 GU:   Ureteral calculus - N20.1 Left, Acute, Systemic Symptoms - He has a 27mm left proximal stone with obstruction and AKI with a small LUP stone. He has persistent pain and nausea. I discussed options including URS and ESWL and he would like to proceed with URS tonight which I believe is appropriate with his persistent symptoms and AKI as well as the density of the stone. I have reviewed the risks of ureteroscopy including bleeding, infection, ureteral injury, need for a stent or secondary procedures, thrombotic events and anesthetic complications.   2   Renal calculus - N20.0 Left, Minor  3   Acute kidney failure - N17.0 Acute, Threat to Bodily Function   PLAN:           Schedule Return Visit/Planned Activity: 2 Weeks - Office Visit, Extender  Procedure: 04/08/2021 - Cysto Uretero Lithotripsy - [phone], left          Document

## 2021-04-08 NOTE — Op Note (Addendum)
Preoperative Diagnosis: Left distal ureteral calculus  Postoperative Diagnosis:  Same  Procedure(s) Performed:   1. Cystourethroscopy 2. Left ureteroscopic stone extraction with laser lithotripsy 3. Left retrograde pyelogram 4. Left ureteral stent placement, 6 Fr x 26 cm JJ ureteral stent with out dangler 5. Intraoperative fluoroscopy with interpretation <1hr.   Teaching Surgeon:  Bjorn Pippin, MD  Resident Surgeon:  Cherlyn Labella, MD  Assistant(s):  None  Anesthesia:  General   Fluids:  See anesthesia record  Estimated blood loss:  Minimal  Specimens:  Left left ureteral calculus for stone analysis  Cultures:    Drains:  LEFT 6 Fr x 26 cm JJ ureteral stent with out dangler  Complications:  None  Indications: 60 y.o. patient with a history of left proximal ureteral stone, refractory pain. Risks & benefits of the procedure discussed with the patient, who wishes to proceed.  Findings: There is a proximal J hooking of the ureter.  Able to advance a 12/14 [place] ureteral access sheath just distal to the level of the ureteral stone.  The stone was treated with a combination of laser lithotripsy and basket extraction.  The renal calyces were cleared visually.  There was some soft debris visualized in the upper pole which was evacuated.  Since ureter was somewhat erythematous and there was a J-hook at the level of the stone, ureteral stent was placed without Dangler.  Radiologic Interpretation of Retrograde Pyelogram: Left retrograde pyelogram demonstrated no contrast extravasation, filling defects or evidence of hydroureteronephrosis.  Description:  The patient was correctly identified in the preop holding area where written informed consent as well potential risk and complication reviewed. The patient agreed. They were brought back to the operative suite where a preinduction timeout was performed. Once correct information was verified, general anesthesia was induced. They were then  gently placed into dorsal lithotomy position with SCDs in place for VTE prophylaxis. They were prepped and draped in the usual sterile fashion and given appropriate preoperative antibiotics with Ancef. A second timeout was then performed.   We inserted a 87F rigid cystoscope per urethra with copious lubrication and normal saline irrigation running. This demonstrated findings as described above.    We cannulated the left ureteral orifice with the 5Fr open ended catheter and a retrograde pyelogram was performed with the above findings.  Next the sensor wire was advanced into the expected location of the left renal pelvis without difficulty. We were able to visualize a radioopacity on fluoroscopy in the left proximal ureter. We then removed the cystoscope leaving our sensor wire in place.  We then advanced a 12 [place] by 14 French 55 cm ureteral access sheath.  We first advanced the inner core and there was no resistance.  Next we advanced both the inner and outer core under fluoroscopic guidance with no resistance.  We advanced just still to the level of the stone.  Requiring ureteral access sheath removed.  Flexible digital dual-lumen ureteroscope was advanced through the sheath and into the ureter.  The stone was visualized.  It was large and obstructing the ureter.  We then obtained a 200 micron holmium laser fiber and performed laser lithotripsy until all of the stone burden was fragmented into several small fragments which were basket extracted using a zero-tip nitinol wire basket without complication.  Stone fragments were sent for chemical analysis.  We then re-explored the location of stone treatment which demonstrated no residual stone burden.  There was some soft debris appreciated in the upper pole which was  evacuated..  At this point we elected to leave a ureteral stent and withdrew our instruments leaving a sensor wire in place.  We withdrew our ureteroscope clearing the ureter visually.  We then  advanced a 6Fr x 26 cm JJ ureteral stent with out dangler with the assistance of a stent pusher under direct fluoroscopic & visual guidance without difficultly.  Sensor wire removal demonstrated satisfactory stent curl proximally in the renal pelvis and distally in the bladder. The bladder was emptied and all instrumentation was removed. The patient was woken up from anesthesia and taken to the recovery unit for routine postoperative care.   Post Op Plan:   1.  Given debris seen in the upper pole, patient will be kept on cefdinir postoperatively.  3-day course was also prescribed to begin the day prior to ureteral stent pull. 2.  It was sent for schedulers to arrange ureteral stent pull on 04/22/2021 3.  Continue Flomax and as needed analgesics  Attestation:  Dr. Annabell Howells was present and scrubbed for the entirety of the procedure.

## 2021-04-08 NOTE — Anesthesia Procedure Notes (Addendum)
Procedure Name: Intubation Date/Time: 04/08/2021 5:33 PM Performed by: Raenette Rover, CRNA Pre-anesthesia Checklist: Patient identified, Emergency Drugs available, Suction available and Patient being monitored Patient Re-evaluated:Patient Re-evaluated prior to induction Oxygen Delivery Method: Circle system utilized Preoxygenation: Pre-oxygenation with 100% oxygen Induction Type: IV induction, Rapid sequence and Cricoid Pressure applied Laryngoscope Size: Mac and 4 Grade View: Grade I Tube type: Oral Tube size: 7.5 mm Number of attempts: 1 Airway Equipment and Method: Stylet Placement Confirmation: ETT inserted through vocal cords under direct vision, positive ETCO2 and breath sounds checked- equal and bilateral Secured at: 23 cm Tube secured with: Tape Dental Injury: Teeth and Oropharynx as per pre-operative assessment

## 2021-04-08 NOTE — Discharge Instructions (Addendum)
IMPORTANT: You have been prescribed 10 days of antibiotics. I want you to take seven days worhth starting now. Then, please start the three day remaining course on the day prior to your ureteral stent pull. (Ureteral stent pull is scheduled for 04/22/21)  You may see some blood in the urine and may have some burning with urination for 48-72 hours. You also may notice that you have to urinate more frequently or urgently after your procedure which is normal.  You should call should you develop an inability urinate, fever > 101, persistent nausea and vomiting that prevents you from eating or drinking to stay hydrated.  If you have a stent, you will likely urinate more frequently and urgently until the stent is removed and you may experience some discomfort/pain in the lower abdomen and flank especially when urinating. You may take pain medication prescribed to you if needed for pain. You may also intermittently have blood in the urine until the stent is removed. If you have a catheter, you will be taught how to take care of the catheter by the nursing staff prior to discharge from the hospital.  You may periodically feel a strong urge to void with the catheter in place.  This is a bladder spasm and most often can occur when having a bowel movement or moving around. It is typically self-limited and usually will stop after a few minutes.  You may use some Vaseline or Neosporin around the tip of the catheter to reduce friction at the tip of the penis. You may also see some blood in the urine.  A very small amount of blood can make the urine look quite red.  As long as the catheter is draining well, there usually is not a problem.  However, if the catheter is not draining well and is bloody, you should call the office [phone]) to notify us.

## 2021-04-08 NOTE — Anesthesia Preprocedure Evaluation (Addendum)
Anesthesia Evaluation  Patient identified by MRN, date of birth, ID band Patient awake    Reviewed: Allergy & Precautions, NPO status , Patient's Chart, lab work & pertinent test results  Airway Mallampati: II  TM Distance: >3 FB Neck ROM: Full    Dental  (+) Dental Advisory Given, Missing, Poor Dentition   Pulmonary Current Smoker,    Pulmonary exam normal breath sounds clear to auscultation       Cardiovascular negative cardio ROS Normal cardiovascular exam Rhythm:Regular Rate:Normal     Neuro/Psych Seizures -,     GI/Hepatic negative GI ROS, Neg liver ROS,   Endo/Other  negative endocrine ROS  Renal/GU negative Renal ROS     Musculoskeletal  (+) Arthritis ,   Abdominal   Peds  Hematology negative hematology ROS (+)   Anesthesia Other Findings   Reproductive/Obstetrics                           Anesthesia Physical Anesthesia Plan  ASA: 2  Anesthesia Plan: General   Post-op Pain Management:    Induction: Intravenous  PONV Risk Score and Plan: 2 and Ondansetron, Dexamethasone, Treatment may vary due to age or medical condition and Midazolam  Airway Management Planned: LMA  Additional Equipment:   Intra-op Plan:   Post-operative Plan: Extubation in OR  Informed Consent: I have reviewed the patients History and Physical, chart, labs and discussed the procedure including the risks, benefits and alternatives for the proposed anesthesia with the patient or authorized representative who has indicated his/her understanding and acceptance.     Dental advisory given  Plan Discussed with: CRNA  Anesthesia Plan Comments:        Anesthesia Quick Evaluation

## 2021-04-08 NOTE — Transfer of Care (Signed)
Immediate Anesthesia Transfer of Care Note  Patient: Jeffery Simmons  Procedure(s) Performed: CYSTOSCOPY/URETEROSCOPY/HOLMIUM LASER/STENT PLACEMENT (Left)  Patient Location: PACU  Anesthesia Type:General  Level of Consciousness: awake, alert , oriented and patient cooperative  Airway & Oxygen Therapy: Patient Spontanous Breathing and Patient connected to face mask oxygen  Post-op Assessment: Report given to RN and Post -op Vital signs reviewed and stable  Post vital signs: Reviewed and stable  Last Vitals:  Vitals Value Taken Time  BP 158/81 04/08/21 1847  Temp    Pulse 68 04/08/21 1849  Resp 13 04/08/21 1849  SpO2 99 % 04/08/21 1849  Vitals shown include unvalidated device data.  Last Pain:  Vitals:   04/08/21 1609  TempSrc: Oral  PainSc: 0-No pain      Patients Stated Pain Goal: 2 (04/08/21 1609)  Complications: No notable events documented.

## 2021-04-08 NOTE — Anesthesia Postprocedure Evaluation (Signed)
Anesthesia Post Note  Patient: Jeffery Simmons  Procedure(s) Performed: CYSTOSCOPY/URETEROSCOPY/HOLMIUM LASER/STENT PLACEMENT (Left)     Patient location during evaluation: PACU Anesthesia Type: General Level of consciousness: sedated and patient cooperative Pain management: pain level controlled Vital Signs Assessment: post-procedure vital signs reviewed and stable Respiratory status: spontaneous breathing Cardiovascular status: stable Anesthetic complications: no   No notable events documented.  Last Vitals:  Vitals:   04/08/21 1945 04/08/21 1958  BP: (!) 171/91 (!) 163/89  Pulse: (!) 51 (!) 55  Resp: 11 13  Temp:  36.5 C  SpO2: 98% 97%    Last Pain:  Vitals:   04/08/21 1915  TempSrc:   PainSc: 0-No pain                 [name]

## 2021-04-12 ENCOUNTER — Other Ambulatory Visit (INDEPENDENT_AMBULATORY_CARE_PROVIDER_SITE_OTHER): Payer: Medicare Other

## 2021-04-12 ENCOUNTER — Other Ambulatory Visit: Payer: Self-pay

## 2021-04-12 ENCOUNTER — Encounter (HOSPITAL_COMMUNITY): Payer: Self-pay | Admitting: Urology

## 2021-04-12 DIAGNOSIS — Z0000 Encounter for general adult medical examination without abnormal findings: Secondary | ICD-10-CM | POA: Diagnosis not present

## 2021-04-12 LAB — URINALYSIS, ROUTINE W REFLEX MICROSCOPIC
Bilirubin Urine: NEGATIVE
Ketones, ur: NEGATIVE
Nitrite: NEGATIVE
Specific Gravity, Urine: 1.02 (ref 1.000–1.030)
Total Protein, Urine: 100 — AB
Urine Glucose: NEGATIVE
Urobilinogen, UA: 1 (ref 0.0–1.0)
pH: 6 (ref 5.0–8.0)

## 2021-04-12 LAB — CBC
HCT: 44.5 % (ref 39.0–52.0)
Hemoglobin: 14.3 g/dL (ref 13.0–17.0)
MCHC: 32.1 g/dL (ref 30.0–36.0)
MCV: 93.1 fl (ref 78.0–100.0)
Platelets: 149 10*3/uL — ABNORMAL LOW (ref 150.0–400.0)
RBC: 4.78 Mil/uL (ref 4.22–5.81)
RDW: 15.5 % (ref 11.5–15.5)
WBC: 5 10*3/uL (ref 4.0–10.5)

## 2021-04-12 LAB — PSA: PSA: 0.5 ng/mL (ref 0.10–4.00)

## 2021-04-12 LAB — COMPREHENSIVE METABOLIC PANEL
ALT: 18 U/L (ref 0–53)
AST: 15 U/L (ref 0–37)
Albumin: 3.9 g/dL (ref 3.5–5.2)
Alkaline Phosphatase: 61 U/L (ref 39–117)
BUN: 19 mg/dL (ref 6–23)
CO2: 31 mEq/L (ref 19–32)
Calcium: 9.6 mg/dL (ref 8.4–10.5)
Chloride: 104 mEq/L (ref 96–112)
Creatinine, Ser: 1.17 mg/dL (ref 0.40–1.50)
GFR: 68.27 mL/min (ref >60.00)
Glucose, Bld: 87 mg/dL (ref 70–99)
Potassium: 4.8 mEq/L (ref 3.5–5.1)
Sodium: 140 mEq/L (ref 135–145)
Total Bilirubin: 0.9 mg/dL (ref 0.2–1.2)
Total Protein: 6.5 g/dL (ref 6.0–8.3)

## 2021-04-12 LAB — LIPID PANEL
Cholesterol: 209 mg/dL — ABNORMAL HIGH (ref 0–200)
HDL: 57.9 mg/dL (ref >39.00)
LDL Cholesterol: 137 mg/dL — ABNORMAL HIGH (ref 0–99)
NonHDL: 150.83
Total CHOL/HDL Ratio: 4
Triglycerides: 67 mg/dL (ref 0.0–149.0)
VLDL: 13.4 mg/dL (ref 0.0–40.0)

## 2021-04-12 NOTE — Progress Notes (Signed)
Ldl cholesterol is slightly elevated. Your risk for vascular diseae is elevated. Recommending a statin for control of your cholesterol. We can discuss on 18th.   The 10-year ASCVD risk score Denman George DC Montez Hageman., et al., 2013) is: 19.2%   Values used to calculate the score:     Age: 60 years     Sex: Male     Is Non-Hispanic African American: Yes     Diabetic: No     Tobacco smoker: Yes     Systolic Blood Pressure: 163 mmHg     Is BP treated: No     HDL Cholesterol: 57.9 mg/dL     Total Cholesterol: 209 mg/dL   There is blood and wbcs in your urine which is consistent with recent procedure.

## 2021-04-15 ENCOUNTER — Other Ambulatory Visit: Payer: Medicare Other

## 2021-04-15 LAB — HIV ANTIBODY (ROUTINE TESTING W REFLEX): HIV 1&2 Ab, 4th Generation: NONREACTIVE

## 2021-04-24 ENCOUNTER — Ambulatory Visit (INDEPENDENT_AMBULATORY_CARE_PROVIDER_SITE_OTHER): Payer: Self-pay | Admitting: Neurology

## 2021-04-24 ENCOUNTER — Encounter: Payer: Self-pay | Admitting: Neurology

## 2021-04-24 ENCOUNTER — Other Ambulatory Visit: Payer: Self-pay

## 2021-04-24 VITALS — BP 149/82 | HR 55 | Ht 69.0 in | Wt 159.0 lb

## 2021-04-24 DIAGNOSIS — Z0289 Encounter for other administrative examinations: Secondary | ICD-10-CM

## 2021-04-25 ENCOUNTER — Ambulatory Visit: Payer: Medicare Other | Admitting: Family Medicine

## 2021-05-03 ENCOUNTER — Ambulatory Visit (INDEPENDENT_AMBULATORY_CARE_PROVIDER_SITE_OTHER): Payer: Medicare Other | Admitting: Family Medicine

## 2021-05-03 ENCOUNTER — Encounter: Payer: Self-pay | Admitting: Family Medicine

## 2021-05-03 ENCOUNTER — Other Ambulatory Visit: Payer: Self-pay

## 2021-05-03 VITALS — BP 112/68 | HR 71 | Temp 97.5°F | Ht 69.0 in | Wt 158.6 lb

## 2021-05-03 DIAGNOSIS — N200 Calculus of kidney: Secondary | ICD-10-CM | POA: Insufficient documentation

## 2021-05-03 DIAGNOSIS — Z960 Presence of urogenital implants: Secondary | ICD-10-CM

## 2021-05-03 DIAGNOSIS — E7800 Pure hypercholesterolemia, unspecified: Secondary | ICD-10-CM

## 2021-05-03 LAB — URINALYSIS, ROUTINE W REFLEX MICROSCOPIC
Nitrite: POSITIVE — AB
Specific Gravity, Urine: >=1.03 — AB (ref 1.000–1.030)
Total Protein, Urine: >=300 — AB
Urine Glucose: NEGATIVE
Urobilinogen, UA: 1 (ref 0.0–1.0)
pH: 5.5 (ref 5.0–8.0)

## 2021-05-03 MED ORDER — TAMSULOSIN HCL 0.4 MG PO CAPS: 0.4000 mg | ORAL_CAPSULE | Freq: Every day | ORAL | 0 refills | Status: DC

## 2021-05-03 MED ORDER — ATORVASTATIN CALCIUM 20 MG PO TABS: 20.0000 mg | ORAL_TABLET | Freq: Every day | ORAL | 3 refills | Status: AC

## 2021-05-03 MED ORDER — KETOROLAC TROMETHAMINE 10 MG PO TABS: 10.0000 mg | ORAL_TABLET | Freq: Four times a day (QID) | ORAL | 0 refills | Status: AC | PRN

## 2021-05-03 MED ORDER — KETOROLAC TROMETHAMINE 60 MG/2ML IM SOLN: 60.0000 mg | Freq: Once | INTRAMUSCULAR | Status: AC

## 2021-05-03 NOTE — Progress Notes (Signed)
Established Patient Office Visit  Subjective:  Patient ID: Jeffery Simmons, male    DOB: 01/21/1961  Age: 60 y.o. MRN: [id number]  CC:  Chief Complaint  Patient presents with   Hematuria    Still having blood in urine and in lots of pain.     HPI Jeffery Simmons presents complaining left flank pain and hematuria.  Status post ureteroscopic stone extraction with laser lithotripsy in the left kidney and ureter on August 1.  Ureteral stent was placed and scheduled to be removed on the 15th.  That did not happen.  Patient did go to see a neurologist for follow-up of his seizure disorder but was rescheduled.  Recent lab work revealed a lipid profile with moderate elevation in LDL cholesterol and favorable HDL level but because of patient's smoking status his 10-year ASCVD risk score is elevated.  A statin was recommended.  He would like to start a statin.  Past Medical History:  Diagnosis Date   Seizures (HCC)    Seizures (HCC)    started in middle school, last several years ago    Past Surgical History:  Procedure Laterality Date   CYSTOSCOPY/URETEROSCOPY/HOLMIUM LASER/STENT PLACEMENT Left 04/08/2021   Procedure: CYSTOSCOPY/URETEROSCOPY/HOLMIUM LASER/STENT PLACEMENT;  Surgeon: Bjorn Pippin, MD;  Location: [facility];  Service: Urology;  Laterality: Left;   HEMORRHOID SURGERY     HEMORRHOID SURGERY     SHOULDER ARTHROSCOPY WITH ROTATOR CUFF REPAIR Bilateral 2019   SHOULDER OPEN ROTATOR CUFF REPAIR Bilateral 2019 2020    Family History  Problem Relation Age of Onset   Stroke Mother     Social History   Socioeconomic History   Marital status: Married    Spouse name: Not on file   Number of children: Not on file   Years of education: Not on file   Highest education level: Not on file  Occupational History   Not on file  Tobacco Use   Smoking status: Every Day    Packs/day: 1.00    Types: Cigarettes   Smokeless tobacco: Never  Vaping Use   Vaping Use: Never used  Substance  and Sexual Activity   Alcohol use: Never   Drug use: Never   Sexual activity: Yes  Other Topics Concern   Not on file  Social History Narrative   Not on file   Social Determinants of Health   Financial Resource Strain: Not on file  Food Insecurity: Not on file  Transportation Needs: Not on file  Physical Activity: Not on file  Stress: Not on file  Social Connections: Not on file  Intimate Partner Violence: Not on file    Outpatient Medications Prior to Visit  Medication Sig Dispense Refill   levETIRAcetam (KEPPRA) 250 MG tablet Take 250 mg by mouth 2 (two) times daily.     ondansetron (ZOFRAN ODT) 4 MG disintegrating tablet Take 1 tablet (4 mg total) by mouth every 8 (eight) hours as needed for nausea or vomiting. 10 tablet 0   ketorolac (TORADOL) 10 MG tablet Take 10 mg by mouth every 6 (six) hours as needed.     naproxen (NAPROSYN) 500 MG tablet Take 1 tablet (500 mg total) by mouth 2 (two) times daily. 15 tablet 0   tamsulosin (FLOMAX) 0.4 MG CAPS capsule Take 1 capsule (0.4 mg total) by mouth daily. 10 capsule 0   oxyCODONE (OXY IR/ROXICODONE) 5 MG immediate release tablet Take 1 tablet (5 mg total) by mouth every 6 (six) hours as needed for severe pain. 8  tablet 0   No facility-administered medications prior to visit.    Allergies  Allergen Reactions   Tylenol [Acetaminophen]     Severe itching    ROS Review of Systems  Constitutional: Negative.  Negative for diaphoresis, fatigue, fever and unexpected weight change.  Respiratory: Negative.    Gastrointestinal: Negative.   Genitourinary:  Positive for flank pain, frequency and hematuria. Negative for difficulty urinating and dysuria.  Psychiatric/Behavioral: Negative.       Objective:    Physical Exam Vitals and nursing note reviewed.  Constitutional:      General: He is not in acute distress.    Appearance: Normal appearance. He is not ill-appearing, toxic-appearing or diaphoretic.  HENT:     Head:  Normocephalic and atraumatic.     Right Ear: Tympanic membrane, ear canal and external ear normal.     Left Ear: Tympanic membrane and external ear normal.     Mouth/Throat:     Mouth: Mucous membranes are moist.     Pharynx: Oropharynx is clear. No oropharyngeal exudate or posterior oropharyngeal erythema.  Eyes:     General: No scleral icterus.       Right eye: No discharge.        Left eye: No discharge.     Conjunctiva/sclera: Conjunctivae normal.  Neck:     Vascular: No carotid bruit.  Cardiovascular:     Rate and Rhythm: Normal rate and regular rhythm.  Pulmonary:     Effort: Pulmonary effort is normal.     Breath sounds: Normal breath sounds.  Abdominal:     Tenderness: There is no right CVA tenderness or left CVA tenderness.  Musculoskeletal:     Cervical back: No rigidity or tenderness.  Lymphadenopathy:     Cervical: No cervical adenopathy.  Neurological:     Mental Status: He is alert and oriented to person, place, and time.  Psychiatric:        Mood and Affect: Mood normal.        Behavior: Behavior normal.    BP 112/68 (BP Location: Left Arm, Patient Position: Sitting, Cuff Size: Normal)   Pulse 71   Temp (!) 97.5 F (36.4 C) (Temporal)   Ht 5\' 9"  (1.753 m)   Wt 158 lb 9.6 oz (71.9 kg)   SpO2 96%   BMI 23.42 kg/m  Wt Readings from Last 3 Encounters:  05/03/21 158 lb 9.6 oz (71.9 kg)  04/24/21 159 lb (72.1 kg)  04/06/21 166 lb (75.3 kg)     Health Maintenance Due  Topic Date Due   Pneumococcal Vaccine 49-70 Years old (1 - PCV) Never done   Hepatitis C Screening  Never done   TETANUS/TDAP  Never done   COLONOSCOPY (Pts 45-67yrs Insurance coverage will need to be confirmed)  Never done   Zoster Vaccines- Shingrix (1 of 2) Never done   INFLUENZA VACCINE  04/08/2021    There are no preventive care reminders to display for this patient.  No results found for: TSH Lab Results  Component Value Date   WBC 5.0 04/12/2021   HGB 14.3 04/12/2021   HCT  44.5 04/12/2021   MCV 93.1 04/12/2021   PLT 149.0 (L) 04/12/2021   Lab Results  Component Value Date   NA 140 04/12/2021   K 4.8 04/12/2021   CO2 31 04/12/2021   GLUCOSE 87 04/12/2021   BUN 19 04/12/2021   CREATININE 1.17 04/12/2021   BILITOT 0.9 04/12/2021   ALKPHOS 61 04/12/2021   AST  15 04/12/2021   ALT 18 04/12/2021   PROT 6.5 04/12/2021   ALBUMIN 3.9 04/12/2021   CALCIUM 9.6 04/12/2021   ANIONGAP 9 04/06/2021   GFR 68.27 04/12/2021   Lab Results  Component Value Date   CHOL 209 (H) 04/12/2021   Lab Results  Component Value Date   HDL 57.90 04/12/2021   Lab Results  Component Value Date   LDLCALC 137 (H) 04/12/2021   Lab Results  Component Value Date   TRIG 67.0 04/12/2021   Lab Results  Component Value Date   CHOLHDL 4 04/12/2021   No results found for: HGBA1C    Assessment & Plan:   Problem List Items Addressed This Visit       Other   Status post placement of ureteral stent - Primary   Relevant Medications   tamsulosin (FLOMAX) 0.4 MG CAPS capsule   ketorolac (TORADOL) 10 MG tablet   ketorolac (TORADOL) injection 60 mg   Other Relevant Orders   Urine Culture   Urinalysis, Routine w reflex microscopic   Ambulatory referral to Urology   Other Visit Diagnoses     Elevated LDL cholesterol level       Relevant Medications   atorvastatin (LIPITOR) 20 MG tablet       Meds ordered this encounter  Medications   tamsulosin (FLOMAX) 0.4 MG CAPS capsule    Sig: Take 1 capsule (0.4 mg total) by mouth daily.    Dispense:  10 capsule    Refill:  0   ketorolac (TORADOL) 10 MG tablet    Sig: Take 1 tablet (10 mg total) by mouth every 6 (six) hours as needed.    Dispense:  20 tablet    Refill:  0   ketorolac (TORADOL) injection 60 mg   atorvastatin (LIPITOR) 20 MG tablet    Sig: Take 1 tablet (20 mg total) by mouth daily.    Dispense:  90 tablet    Refill:  3    Follow-up: Return Follow up with urology for stent removal or to ER if worse..   Follow-up for scheduled appointment in October or sooner if needed.  Mliss Sax, MD

## 2021-05-04 LAB — URINE CULTURE
MICRO NUMBER:: 12296973
Result:: NO GROWTH
SPECIMEN QUALITY:: ADEQUATE

## 2021-05-06 ENCOUNTER — Telehealth: Payer: Self-pay | Admitting: Family Medicine

## 2021-05-06 ENCOUNTER — Ambulatory Visit: Payer: Medicare Other | Admitting: Diagnostic Neuroimaging

## 2021-05-06 NOTE — Telephone Encounter (Signed)
I spoke with this patient after Whitney. He said he was in pain and had to speak with the nurse, Tequila. I was pulling his chart up and I could hear him saying something but couldn't understand. I said, "Can  you repeat that?"   He said, "Ma'am, you need to do whatever the  [facility] you need to do to get my "GD" nurse on the line. I spoke with Brendia Sacks who already knew he was calling but she was with patients at the time and said she would call him back once she checked with Silva Bandy about his referral. This patient said if he didn't speak to someone now he would keep calling back.

## 2021-05-06 NOTE — Telephone Encounter (Signed)
I do not have a specific word or phrase, bc I did not document at the time. But this pt is continuously irate with admin staff. One day, he was in here without an appointment demanding to see Dr Doreene Burke (his dr).  Tinika had to calm him down and advise he go to urgent care. 9 times out of 10 when he calls, he is cussing and unreasonably irritated with staff. WM

## 2021-05-06 NOTE — Telephone Encounter (Signed)
[  9:56 AM] Jeffery Simmons Hey are you able to check on the referral for Jeffery Simmons ([id number]) to urology, placed on 05/03/21. Patient calling in lots of pain fussing at the front.   [11:29 AM] Jeffery Simmons No referral was actually necessary. The procedure was done at [facility] by Dr. Annabell Howells with [facility]. Dr. Annabell Howells noted pt was to be scheduled for removal in 15 days (from 8/1 notes) by his office. We could have just advised pt to call them. I have called and spoke to their RN and pt has appt today 1p to have removed. Pt is aware and will call me back if he cannot go today. Pt declined to take the direct ph# to [facility] to follow up with them directly.

## 2021-05-06 NOTE — Telephone Encounter (Signed)
Dr. Doreene Burke - Pt routinely uses profanity and is disrespectful to front office staff. This is not a reasonable expectation for staff. I wanted to make you aware to determine course of action with patient.   TY Danford Bad

## 2021-05-10 ENCOUNTER — Other Ambulatory Visit: Payer: Self-pay | Admitting: Family Medicine

## 2021-05-10 DIAGNOSIS — Z960 Presence of urogenital implants: Secondary | ICD-10-CM

## 2021-05-22 NOTE — Telephone Encounter (Signed)
Have spoken with Dr. Doreene Burke and advised this may be situation given patient's situation. Please advise if further incidents occur.

## 2021-05-23 ENCOUNTER — Encounter: Payer: Self-pay | Admitting: Emergency Medicine

## 2021-05-23 ENCOUNTER — Other Ambulatory Visit: Payer: Self-pay

## 2021-05-23 ENCOUNTER — Ambulatory Visit
Admission: EM | Admit: 2021-05-23 | Discharge: 2021-05-23 | Disposition: A | Discharge status: Home / Self Care | Payer: Medicare Other | Attending: Internal Medicine | Admitting: Internal Medicine

## 2021-05-23 DIAGNOSIS — Z20822 Contact with and (suspected) exposure to covid-19: Secondary | ICD-10-CM | POA: Diagnosis not present

## 2021-05-23 DIAGNOSIS — J069 Acute upper respiratory infection, unspecified: Secondary | ICD-10-CM | POA: Diagnosis not present

## 2021-05-23 MED ORDER — GUAIFENESIN 200 MG PO TABS: 200.0000 mg | ORAL_TABLET | ORAL | 0 refills | Status: AC | PRN

## 2021-05-23 NOTE — Discharge Instructions (Signed)
You have a viral upper respiratory infection that should resolve in the next few days.  You have been prescribed guaifenesin to take as needed for cough.  Covid 19 test is pending.  We will call if it is positive.

## 2021-05-23 NOTE — ED Provider Notes (Signed)
[facility]    CSN: [id number] Arrival date & time: 05/23/21  1549      History   Chief Complaint Chief Complaint  Patient presents with   Cough    HPI Jeffery Simmons is a 60 y.o. male.   Patient presents with productive cough, body chills, nasal congestion that have been present for 2 days.  Patient is not sure of the color of mucus that is being produced from the cough.  Denies any chest pain or shortness of breath.  Has taken Sudafed, DayQuil, NyQuil with minimal relief of symptoms.   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   Healthcare maintenance 12/31/2020   Androgen deficiency 12/31/2020   Seizure disorder (HCC) 05/15/2020   Tobacco use 05/15/2020    Past Surgical History:  Procedure Laterality Date   CYSTOSCOPY/URETEROSCOPY/HOLMIUM LASER/STENT PLACEMENT Left 04/08/2021   Procedure: CYSTOSCOPY/URETEROSCOPY/HOLMIUM LASER/STENT PLACEMENT;  Surgeon: Bjorn Pippin, MD;  Location: [facility];  Service: Urology;  Laterality: Left;   HEMORRHOID SURGERY     HEMORRHOID SURGERY     SHOULDER ARTHROSCOPY WITH ROTATOR CUFF REPAIR Bilateral 2019   SHOULDER OPEN ROTATOR CUFF REPAIR Bilateral 2019 2020       Home Medications    Prior to Admission medications   Medication Sig Start Date End Date Taking? Authorizing Provider  atorvastatin (LIPITOR) 20 MG tablet Take 1 tablet (20 mg total) by mouth daily. 05/03/21  Yes Mliss Sax, MD  guaiFENesin 200 MG tablet Take 1 tablet (200 mg total) by mouth every 4 (four) hours as needed for cough or to loosen phlegm. 05/23/21  Yes Lance Muss, FNP   ketorolac (TORADOL) 10 MG tablet Take 1 tablet (10 mg total) by mouth every 6 (six) hours as needed. 05/03/21  Yes Mliss Sax, MD  levETIRAcetam (KEPPRA) 250 MG tablet Take 250 mg by mouth 2 (two) times daily.   Yes [provider]  ondansetron (ZOFRAN ODT) 4 MG disintegrating tablet Take 1 tablet (4 mg total) by mouth every 8 (eight) hours as needed for nausea or vomiting. 04/06/21  Yes Renne Crigler, PA-C  tamsulosin (FLOMAX) 0.4 MG CAPS capsule TAKE 1 CAPSULE(0.4 MG) BY MOUTH DAILY 05/10/21  Yes Mliss Sax, MD    Family History Family History  Problem Relation Age of Onset   Stroke Mother     Social History Social History   Tobacco Use   Smoking status: Every Day    Packs/day: 1.00    Types: Cigarettes   Smokeless tobacco: Never  Vaping Use   Vaping Use: Never used  Substance Use Topics   Alcohol use: Never   Drug use: Never     Allergies   Tylenol [acetaminophen]   Review of Systems Review of Systems Per HPI  Physical Exam Triage Vital Signs ED Triage Vitals  Enc Vitals Group     BP 05/23/21 1647 132/86     Pulse Rate 05/23/21 1647 91     Resp 05/23/21 1647 18     Temp 05/23/21 1647 99 F (37.2 C)     Temp Source 05/23/21 1647  Oral     SpO2 05/23/21 1647 95 %     Weight 05/23/21 1649 154 lb (69.9 kg)     Height 05/23/21 1649 5\' 9"  (1.753 m)     Head Circumference --      Peak Flow --      Pain Score 05/23/21 1649 0     Pain Loc --      Pain Edu? --      Excl. in GC? --    No data found.  Updated Vital Signs BP 132/86 (BP Location: Left Arm)   Pulse 91   Temp 99 F (37.2 C) (Oral)   Resp 18   Ht 5\' 9"  (1.753 m)   Wt 154 lb (69.9 kg)   SpO2 95%   BMI 22.74 kg/m   Visual Acuity Right Eye Distance:   Left Eye Distance:   Bilateral Distance:    Right Eye Near:   Left Eye Near:    Bilateral Near:     Physical Exam Constitutional:      General: He is not in acute distress.    Appearance: Normal appearance.  He is not ill-appearing, toxic-appearing or diaphoretic.  HENT:     Head: Normocephalic and atraumatic.     Right Ear: Tympanic membrane and ear canal normal.     Left Ear: Tympanic membrane and ear canal normal.     Nose: Congestion present.     Mouth/Throat:     Mouth: Mucous membranes are moist.     Pharynx: No posterior oropharyngeal erythema.  Eyes:     Extraocular Movements: Extraocular movements intact.     Conjunctiva/sclera: Conjunctivae normal.     Pupils: Pupils are equal, round, and reactive to light.  Cardiovascular:     Rate and Rhythm: Normal rate and regular rhythm.     Pulses: Normal pulses.     Heart sounds: Normal heart sounds.  Pulmonary:     Effort: Pulmonary effort is normal. No respiratory distress.     Breath sounds: Normal breath sounds. No wheezing.  Abdominal:     General: Abdomen is flat. Bowel sounds are normal.     Palpations: Abdomen is soft.  Musculoskeletal:        General: Normal range of motion.     Cervical back: Normal range of motion.  Skin:    General: Skin is warm and dry.  Neurological:     General: No focal deficit present.     Mental Status: He is alert and oriented to person, place, and time. Mental status is at baseline.  Psychiatric:        Mood and Affect: Mood normal.        Behavior: Behavior normal.     UC Treatments / Results  Labs (all labs ordered are listed, but only abnormal results are displayed) Labs Reviewed  NOVEL CORONAVIRUS, NAA    EKG   Radiology No results found.  Procedures Procedures (including critical care time)  Medications Ordered in UC Medications - No data to display  Initial Impression / Assessment and Plan / UC Course  I have reviewed the triage vital signs and the nursing notes.  Pertinent labs & imaging results that were available during my care of the patient were reviewed by me and considered in my medical decision making (see chart for details).     Patient presents with  symptoms likely from a viral upper respiratory infection. Differential includes bacterial pneumonia, sinusitis, allergic rhinitis, Covid 19. Do not suspect underlying  cardiopulmonary process. Symptoms seem unlikely related to ACS, CHF or COPD exacerbations, pneumonia, pneumothorax. Patient is nontoxic appearing and not in need of emergent medical intervention.  Limited options for over-the-counter medications to alleviate symptoms due to patient's history of seizures.  Advised patient to avoid Sudafed or any medications that ended in D or DM as these may lower the seizure threshold.  Patient was prescribed guaifenesin to take as needed for cough. Covid 19 PCR pending.  Return if symptoms fail to improve in 1-2 weeks or you develop shortness of breath, chest pain, severe headache. Patient states understanding and is agreeable.  Discharged with PCP followup.  Final Clinical Impressions(s) / UC Diagnoses   Final diagnoses:  Viral upper respiratory tract infection with cough  Encounter for laboratory testing for COVID-19 virus     Discharge Instructions      You have a viral upper respiratory infection that should resolve in the next few days.  You have been prescribed guaifenesin to take as needed for cough.  Covid 19 test is pending.  We will call if it is positive.     ED Prescriptions     Medication Sig Dispense Auth. Provider   guaiFENesin 200 MG tablet Take 1 tablet (200 mg total) by mouth every 4 (four) hours as needed for cough or to loosen phlegm. 30 suppository Lance Muss, FNP      PDMP not reviewed this encounter.   Lance Muss, FNP 05/23/21 1711

## 2021-05-23 NOTE — ED Triage Notes (Signed)
Patient c/o productive cough since Tuesday, body chills, congestion.  Patient has been taken OTC cold meds and Dayquil.  Patient is vaccinated for COVID.

## 2021-05-25 LAB — NOVEL CORONAVIRUS, NAA: SARS-CoV-2, NAA: DETECTED — AB

## 2021-05-25 LAB — SARS-COV-2, NAA 2 DAY TAT

## 2021-06-05 ENCOUNTER — Ambulatory Visit: Payer: Medicare Other | Admitting: Neurology

## 2021-06-05 ENCOUNTER — Telehealth: Payer: Self-pay | Admitting: Neurology

## 2021-06-05 NOTE — Telephone Encounter (Signed)
FYI- pt had to reschedule his appt today due to COVID.

## 2021-06-27 ENCOUNTER — Encounter: Payer: Self-pay | Admitting: Neurology

## 2021-06-27 ENCOUNTER — Ambulatory Visit (INDEPENDENT_AMBULATORY_CARE_PROVIDER_SITE_OTHER): Payer: Medicare Other | Admitting: Neurology

## 2021-06-27 VITALS — BP 156/83 | HR 62 | Ht 69.0 in | Wt 162.0 lb

## 2021-06-27 DIAGNOSIS — G8929 Other chronic pain: Secondary | ICD-10-CM | POA: Diagnosis not present

## 2021-06-27 DIAGNOSIS — R519 Headache, unspecified: Secondary | ICD-10-CM | POA: Diagnosis not present

## 2021-06-27 DIAGNOSIS — G40909 Epilepsy, unspecified, not intractable, without status epilepticus: Secondary | ICD-10-CM | POA: Diagnosis not present

## 2021-06-27 MED ORDER — SUMATRIPTAN SUCCINATE 50 MG PO TABS: 50.0000 mg | ORAL_TABLET | ORAL | 0 refills | Status: DC | PRN

## 2021-06-27 MED ORDER — GABAPENTIN 100 MG PO CAPS: 100.0000 mg | ORAL_CAPSULE | Freq: Every day | ORAL | 0 refills | Status: AC

## 2021-06-27 MED ORDER — LAMOTRIGINE 25 MG PO TABS: 25.0000 mg | ORAL_TABLET | Freq: Every day | ORAL | 0 refills | Status: DC
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
## 2021-06-27 NOTE — Patient Instructions (Addendum)
Continue with Keppra 500 mg nightly  Start with Lamotrigine as below:  Week 1: 25mg  (one pill) at night  Week 2: 25mg  (one pill) twice daily Week 3: 25mg  (one pill) morning and 50mg  (two pills) night  Week 4: 50mg  (two pills) twice daily  Week 5: 75mg  (three pills) twice daily Week 6: 100mg  (fours pills) twice daily  Will obtain a Lamotrigine level (blood test) and complete metabolic panel after week 6  Please stop the medication and call the office as soon as you develop a new rash  Start with Gabapentin 100 mg nightly for the headaches  Take Sumatriptan as soon as the headaches start, can take a second dose after 2 hours if the headaches not resolved    Return in 6 weeks.     Per [facility] statutes, patients with seizures are not allowed to drive until they have been seizure-free for six months.  Other recommendations include using caution when using heavy equipment or power tools. Avoid working on ladders or at heights. Take showers instead of baths.  Do not swim alone.  Ensure the water temperature is not too high on the home water heater. Do not go swimming alone. Do not lock yourself in a room alone (i.e. bathroom). When caring for infants or small children, sit down when holding, feeding, or changing them to minimize risk of injury to the child in the event you have a seizure. Maintain good sleep hygiene. Avoid alcohol.  Also recommend adequate sleep, hydration, good diet and minimize stress.   During the Seizure  - First, ensure adequate ventilation and place patients on the floor on their left side  Loosen clothing around the neck and ensure the airway is patent. If the patient is clenching the teeth, do not force the mouth open with any object as this can cause severe damage - Remove all items from the surrounding that can be hazardous. The patient may be oblivious to what's happening and may not even know what he or she is doing. If the patient is confused and  wandering, either gently guide him/her away and block access to outside areas - Reassure the individual and be comforting - Call 911. In most cases, the seizure ends before EMS arrives. However, there are cases when seizures may last over 3 to 5 minutes. Or the individual may have developed breathing difficulties or severe injuries. If a pregnant patient or a person with diabetes develops a seizure, it is prudent to call an ambulance. - Finally, if the patient does not regain full consciousness, then call EMS. Most patients will remain confused for about 45 to 90 minutes after a seizure, so you must use judgment in calling for help. - Avoid restraints but make sure the patient is in a bed with padded side rails - Place the individual in a lateral position with the neck slightly flexed; this will help the saliva drain from the mouth and prevent the tongue from falling backward - Remove all nearby furniture and other hazards from the area - Provide verbal assurance as the individual is regaining consciousness - Provide the patient with privacy if possible - Call for help and start treatment as ordered by the caregiver   After the Seizure (Postictal Stage)  After a seizure, most patients experience confusion, fatigue, muscle pain and/or a headache. Thus, one should permit the individual to sleep. For the next few days, reassurance is essential. Being calm and helping reorient the person is also of importance.  Most seizures are painless and end spontaneously. Seizures are not harmful to others but can lead to complications such as stress on the lungs, brain and the heart. Individuals with prior lung problems may develop labored breathing and respiratory distress.

## 2021-07-05 ENCOUNTER — Ambulatory Visit: Payer: Medicare Other | Admitting: Family Medicine

## 2021-07-05 ENCOUNTER — Telehealth: Payer: Self-pay | Admitting: Neurology

## 2021-07-05 NOTE — Telephone Encounter (Signed)
Pt requesting refill for SUMAtriptan (IMITREX) 50 MG tablet. Pharmacy [facility] [phone] - [place], [place] - [address] AT [facility] [address]

## 2021-07-05 NOTE — Telephone Encounter (Signed)
Refill was sent on 06/27/2021.

## 2021-07-11 ENCOUNTER — Ambulatory Visit: Payer: Medicare Other | Admitting: Family Medicine

## 2021-07-12 ENCOUNTER — Other Ambulatory Visit: Payer: Self-pay | Admitting: Neurology

## 2021-07-15 ENCOUNTER — Telehealth: Payer: Self-pay | Admitting: Neurology

## 2021-07-15 NOTE — Telephone Encounter (Signed)
Per Epic, #10 tablets of sumatriptan sent to pharmacy on 06/27/21. This is a month supply. We need to confirm how this medication is being used. Left message for patient's wife (on [place]) to return out call for clarification.

## 2021-07-15 NOTE — Telephone Encounter (Signed)
We refused refill from pharmacy until we received a call back for patient or wife. Need to clarify dosing schedule to make sure he is not overusing the medication.

## 2021-07-15 NOTE — Telephone Encounter (Signed)
Pt's wife called requesting refill for pt's SUMAtriptan (IMITREX) 50 MG tablet. Pharmacy [facility] [phone] - [place], [place] - [address] AT [facility] OF [address]

## 2021-07-16 ENCOUNTER — Ambulatory Visit: Payer: Medicare Other | Admitting: Neurology

## 2021-07-17 MED ORDER — SUMATRIPTAN SUCCINATE 50 MG PO TABS: ORAL_TABLET | ORAL | 0 refills | Status: DC

## 2021-07-17 NOTE — Telephone Encounter (Signed)
I spoke to the patient's wife. States he took all the sumatriptan doses within a couple of weeks. Educated them that he should only use them at the onset of a migraine. No more than 2 doses in one day or 10 doses in one month. He should really try not to medicate minor headaches. He was just started on gabapentin 100mg  at bedtime with the goal of reducing his headache frequency. Understanding verbalized. New rx sent to pharmacy with the discussed limitations placed in the directions.

## 2021-07-17 NOTE — Addendum Note (Signed)
Addended by: Lindell Spar C on: 07/17/2021 02:43 PM   Modules accepted: Orders

## 2021-07-17 NOTE — Telephone Encounter (Signed)
Pt's wife returning a call. She states she is headed to work so a good time to call her back would be in the morning

## 2021-07-18 ENCOUNTER — Emergency Department (HOSPITAL_COMMUNITY)
Admission: EM | Admit: 2021-07-18 | Discharge: 2021-07-18 | Disposition: A | Discharge status: Home / Self Care | Payer: Medicare Other | Attending: Emergency Medicine | Admitting: Emergency Medicine

## 2021-07-18 ENCOUNTER — Emergency Department (HOSPITAL_COMMUNITY): Payer: Medicare Other

## 2021-07-18 ENCOUNTER — Encounter (HOSPITAL_COMMUNITY): Payer: Self-pay

## 2021-07-18 ENCOUNTER — Other Ambulatory Visit: Payer: Self-pay

## 2021-07-18 DIAGNOSIS — G40909 Epilepsy, unspecified, not intractable, without status epilepticus: Secondary | ICD-10-CM | POA: Diagnosis not present

## 2021-07-18 DIAGNOSIS — F17210 Nicotine dependence, cigarettes, uncomplicated: Secondary | ICD-10-CM | POA: Diagnosis not present

## 2021-07-18 DIAGNOSIS — R42 Dizziness and giddiness: Secondary | ICD-10-CM | POA: Insufficient documentation

## 2021-07-18 DIAGNOSIS — R569 Unspecified convulsions: Secondary | ICD-10-CM | POA: Diagnosis present

## 2021-07-18 LAB — CBG MONITORING, ED: Glucose-Capillary: 86 mg/dL (ref 70–99)

## 2021-07-18 LAB — PHOSPHORUS: Phosphorus: 3.4 mg/dL (ref 2.5–4.6)

## 2021-07-18 LAB — COMPREHENSIVE METABOLIC PANEL
ALT: 13 U/L (ref 0–44)
AST: 32 U/L (ref 15–41)
Albumin: 4.2 g/dL (ref 3.5–5.0)
Alkaline Phosphatase: 62 U/L (ref 38–126)
Anion gap: 8 (ref 5–15)
BUN: 21 mg/dL — ABNORMAL HIGH (ref 6–20)
CO2: 26 mmol/L (ref 22–32)
Calcium: 9.4 mg/dL (ref 8.9–10.3)
Chloride: 106 mmol/L (ref 98–111)
Creatinine, Ser: 0.93 mg/dL (ref 0.61–1.24)
GFR, Estimated: >60 mL/min (ref >60)
Glucose, Bld: 78 mg/dL (ref 70–99)
Potassium: 4.5 mmol/L (ref 3.5–5.1)
Sodium: 140 mmol/L (ref 135–145)
Total Bilirubin: 1.3 mg/dL — ABNORMAL HIGH (ref 0.3–1.2)
Total Protein: 7.1 g/dL (ref 6.5–8.1)

## 2021-07-18 LAB — CBC WITH DIFFERENTIAL/PLATELET
Abs Immature Granulocytes: 0.01 10*3/uL (ref 0.00–0.07)
Basophils Absolute: 0 10*3/uL (ref 0.0–0.1)
Basophils Relative: 1 %
Eosinophils Absolute: 0.1 10*3/uL (ref 0.0–0.5)
Eosinophils Relative: 2 %
HCT: 43.2 % (ref 39.0–52.0)
Hemoglobin: 13.7 g/dL (ref 13.0–17.0)
Immature Granulocytes: 0 %
Lymphocytes Relative: 44 %
Lymphs Abs: 1.8 10*3/uL (ref 0.7–4.0)
MCH: 30 pg (ref 26.0–34.0)
MCHC: 31.7 g/dL (ref 30.0–36.0)
MCV: 94.7 fL (ref 80.0–100.0)
Monocytes Absolute: 0.4 10*3/uL (ref 0.1–1.0)
Monocytes Relative: 9 %
Neutro Abs: 1.8 10*3/uL (ref 1.7–7.7)
Neutrophils Relative %: 44 %
Platelets: 150 10*3/uL (ref 150–400)
RBC: 4.56 MIL/uL (ref 4.22–5.81)
RDW: 14.9 % (ref 11.5–15.5)
WBC: 4 10*3/uL (ref 4.0–10.5)
nRBC: 0 % (ref 0.0–0.2)

## 2021-07-18 LAB — MAGNESIUM: Magnesium: 2.4 mg/dL (ref 1.7–2.4)

## 2021-07-18 NOTE — ED Notes (Signed)
Patient refused xray. Patient states she does not want it.

## 2021-07-18 NOTE — ED Triage Notes (Signed)
Pt bib [facility] from work. Coworkers found patient on the floor  stating seizure lasting for 40 seconds. Medic gave 5 of versed. Patient is noncomplaint with dilantin and Keppra  because they make him sleepy and itchy. Patient was A&Ox4 when EMS arrived.

## 2021-07-18 NOTE — Discharge Instructions (Signed)
Follow-up with your neurologist.  You should call him this week and let them know that you are no longer taking the Keppra due to having side effects.  Please return to the emergency department if you develop worsening symptoms.

## 2021-07-18 NOTE — ED Provider Notes (Signed)
[facility] DEPT Provider Note   CSN: [id number] Arrival date & time: 07/18/21  1831     History No chief complaint on file.   Jeffery Simmons is a 60 y.o. male.  He has a past medical history of seizure disorder.  Patient presents after having a seizure 40 seconds while working.  At the time he had fallen on his right shoulder.  Apparently he had 2 seizures and unsure how long the second one was.  He had some dizziness after this event, but no confusion or lasting symptoms.  He was given 5 of Versed via EMS.  He sees a neurologist for chronic epilepsy.  He is supposed to be using Keppra 500 mg daily, but he stopped taking this about 1 week ago because it was causing him itchiness.  He has previously been on Dilantin and Depakote and also had similar problems with that medication.  He refuses to take these medications any further.  He states that he is back to his baseline at this point.  Denies any headaches, weakness to extremities, numbness to extremities, speech difficulties, facial droop, swallowing difficulties, gait abnormalities, any other arthralgias.  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   Healthcare maintenance 12/31/2020   Androgen deficiency 12/31/2020   Seizure disorder ([place]) 05/15/2020   Tobacco use 05/15/2020    Past Surgical History:  Procedure Laterality Date   CYSTOSCOPY/URETEROSCOPY/HOLMIUM LASER/STENT PLACEMENT Left 04/08/2021   Procedure: CYSTOSCOPY/URETEROSCOPY/HOLMIUM LASER/STENT PLACEMENT;  Surgeon: Irine Seal, MD;  Location: [facility];  Service:  Urology;  Laterality: Left;   HEMORRHOID SURGERY     HEMORRHOID SURGERY     SHOULDER ARTHROSCOPY WITH ROTATOR CUFF REPAIR Bilateral 2019   SHOULDER OPEN ROTATOR CUFF REPAIR Bilateral 2019 2020       Family History  Problem Relation Age of Onset   Stroke Mother     Social History   Tobacco Use   Smoking status: Every Day    Packs/day: 1.00    Types: Cigarettes   Smokeless tobacco: Never  Vaping Use   Vaping Use: Never used  Substance Use Topics   Alcohol use: Never   Drug use: Never    Home Medications Prior to Admission medications   Medication Sig Start Date End Date Taking? Authorizing Provider  atorvastatin (LIPITOR) 20 MG tablet Take 1 tablet (20 mg total) by mouth daily. 05/03/21   Libby Maw, MD  gabapentin (NEURONTIN) 100 MG capsule Take 1 capsule (100 mg total) by mouth at bedtime. 06/27/21   Alric Ran, MD  guaiFENesin 200 MG tablet Take 1 tablet (200 mg total) by mouth every 4 (four) hours as needed for cough or to loosen phlegm. 05/23/21   Teodora Medici, FNP  ketorolac (TORADOL) 10 MG tablet Take 1 tablet (10 mg total) by mouth every 6 (six) hours as needed. 05/03/21   Libby Maw, MD  lamoTRIgine (LAMICTAL) 25 MG tablet Take 1 tablet (25 mg total) by mouth daily. 06/27/21   Alric Ran, MD  levETIRAcetam (KEPPRA) 250 MG tablet Take 250 mg by mouth  2 (two) times daily.    [provider]  ondansetron (ZOFRAN ODT) 4 MG disintegrating tablet Take 1 tablet (4 mg total) by mouth every 8 (eight) hours as needed for nausea or vomiting. 04/06/21   Carlisle Cater, PA-C  SUMAtriptan (IMITREX) 50 MG tablet Take 1 tab at onset of migraine. May repeat in 2 hrs, if needed. Max dose: 2 tabs/day.This is a 30 day prescription. No early refills. 07/17/21   Alric Ran, MD  tamsulosin (FLOMAX) 0.4 MG CAPS capsule TAKE 1 CAPSULE(0.4 MG) BY MOUTH DAILY 05/10/21   Libby Maw, MD    Allergies    Tylenol [acetaminophen]  Review of  Systems   Review of Systems  Constitutional:  Negative for chills and fever.  HENT:  Negative for congestion, rhinorrhea and sore throat.   Eyes:  Negative for visual disturbance.  Respiratory:  Negative for cough, chest tightness and shortness of breath.   Cardiovascular:  Negative for chest pain, palpitations and leg swelling.  Gastrointestinal:  Negative for abdominal pain, blood in stool, constipation, diarrhea, nausea and vomiting.  Genitourinary:  Negative for dysuria, flank pain and hematuria.  Musculoskeletal:  Negative for back pain.  Skin:  Negative for rash and wound.  Neurological:  Positive for seizures. Negative for dizziness, syncope, weakness, light-headedness and headaches.  Psychiatric/Behavioral:  Negative for confusion.   All other systems reviewed and are negative.  Physical Exam Updated Vital Signs BP (!) 153/87   Pulse (!) 56   Temp 98 F (36.7 C) (Oral)   Resp 17   Ht 5\' 9"  (1.753 m)   Wt 73.5 kg   SpO2 100%   BMI 23.93 kg/m   Physical Exam Vitals and nursing note reviewed.  Constitutional:      General: He is not in acute distress.    Appearance: Normal appearance. He is well-developed. He is not ill-appearing, toxic-appearing or diaphoretic.  HENT:     Head: Normocephalic and atraumatic.     Nose: No nasal deformity.     Mouth/Throat:     Lips: Pink. No lesions.     Mouth: Mucous membranes are moist.     Pharynx: Oropharynx is clear. No oropharyngeal exudate or posterior oropharyngeal erythema.     Comments: Tongue midline, uvula midline. Eyes:     General: Gaze aligned appropriately. No visual field deficit or scleral icterus.       Right eye: No discharge.        Left eye: No discharge.     Extraocular Movements: Extraocular movements intact.     Conjunctiva/sclera: Conjunctivae normal.     Right eye: Right conjunctiva is not injected. No exudate or hemorrhage.    Left eye: Left conjunctiva is not injected. No exudate or hemorrhage.     Pupils: Pupils are equal, round, and reactive to light.  Pulmonary:     Effort: Pulmonary effort is normal. No respiratory distress.  Skin:    General: Skin is warm and dry.  Neurological:     General: No focal deficit present.     Mental Status: He is alert and oriented to person, place, and time. Mental status is at baseline.     GCS: GCS eye subscore is 4. GCS verbal subscore is 5. GCS motor subscore is 6.     Cranial Nerves: Cranial nerves 2-12 are intact. No cranial nerve deficit, dysarthria or facial asymmetry.     Sensory: Sensation is intact. No sensory deficit.     Motor: Motor function is intact.  No weakness, tremor, atrophy, abnormal muscle tone, seizure activity or pronator drift.     Coordination: Coordination is intact. Finger-Nose-Finger Test and Heel to [place] Test normal.     Comments: Nerves II through XII intact.  Sensation grossly intact in all extremities.  5 out of 5 motor function in all 4 extremities.  No pronator drift.  No signs of tremors.  Finger-nose and heel-to-shin normal.  Psychiatric:        Mood and Affect: Mood normal.        Speech: Speech normal.        Behavior: Behavior normal. Behavior is cooperative.    ED Results / Procedures / Treatments   Labs (all labs ordered are listed, but only abnormal results are displayed) Labs Reviewed  COMPREHENSIVE METABOLIC PANEL - Abnormal; Notable for the following components:      Result Value   BUN 21 (*)    Total Bilirubin 1.3 (*)    All other components within normal limits  CBC WITH DIFFERENTIAL/PLATELET  MAGNESIUM  PHOSPHORUS  LEVETIRACETAM LEVEL  CBG MONITORING, ED    EKG EKG Interpretation  Date/Time:  Thursday July 18 2021 19:34:25 EST Ventricular Rate:  58 PR Interval:  160 QRS Duration: 91 QT Interval:  452 QTC Calculation: 444 R Axis:   38 Text Interpretation: Sinus rhythm No significant change since last tracing Confirmed by Susy Frizzle [phone]) on 07/18/2021 8:24:40  PM  Radiology No results found.  Procedures Procedures   Medications Ordered in ED Medications - No data to display  ED Course  I have reviewed the triage vital signs and the nursing notes.  Pertinent labs & imaging results that were available during my care of the patient were reviewed by me and considered in my medical decision making (see chart for details).    MDM Rules/Calculators/A&P                          Presents after a seizure that he had earlier today.  Patient with history of epilepsy and noncompliance with medications.  Patient is not postictal at this time is back to baseline. His vital signs are stable. Exam with no neurological deficits.  Appears to be in no acute distress. I feel this is a breakthrough seizure due to medication noncompliance.  Labs with no acute abnormalities.  With no evidence of seizure activity during time in the emergency department.  Patient not postictal.  Patient with known history of epileptic disorder.  Feel he is stable for discharge.  Recommend calling neurology doctor tomorrow if he has not been to continue taking his Keppra for recommendations of further medication management.   Final Clinical Impression(s) / ED Diagnoses Final diagnoses:  Seizure [facility])    Rx / DC Orders ED Discharge Orders     None        Claudie Leach, PA-C 07/18/21 2240    Pollyann Savoy, MD 07/18/21 2259

## 2021-07-22 LAB — LEVETIRACETAM LEVEL: Levetiracetam Lvl: <2 ug/mL — ABNORMAL LOW (ref 10.0–40.0)

## 2021-07-22 NOTE — Telephone Encounter (Signed)
Pt's wife is asking for a call back from [place], [place] re: pt's sumatriptan being returned from the pharmacy.

## 2021-07-22 NOTE — Telephone Encounter (Signed)
I spoke to wife, Lewanda Rife, on [place]. Reports the patient has a two seizures last Thursday, 07/18/21, while at work. States he developed a "pounding headache" and felt hot. He went to the bathroom and splashed water on his face. When he came back out, he still had severe head pain and loss consciousness shortly after. His wife was unable to tell me how long the event lasted but was told his body was jerking. EMS was called. She works in the same [employer] and was with him within ten minutes of EMS arriving. States patient started to have another seizure, arm shaking, lasting about three minutes. He was given "a shot to calm him" then taken to the ED. The patient says he had stopped taking both his levetiracetam and lamotrigine (he had just started it - only taking one tab QHS). He restarted it on Friday, 07/19/21 as follows:  1) levetiracetam 250mg , one tab QHS 2) lamotrigine 25mg , one tab QHS and plans to follow the titration schedule   He was short-spoken on the phone and raising his voice in the background when I was speaking to his wife. Says he does not like taking levetiracetam because it makes him itch. His notes also indicate the medication is causing irritability and anger.  His wife has to be at work today at and is unable to accept calls. She would like a follow up call between 10-11am tomorrow morning. She ask that we speak to her rather than the patient. Her best number is [phone]. She would like to know if any other medication changes should be made. Also, she wants to know if she should increase his levetiracetam back up to 500mg  at bedtime, despite his complaint of itching since it is just for the time he will be titrating up his lamotrigine.

## 2021-07-22 NOTE — Telephone Encounter (Signed)
Pt's wife Clara called also wanting to inform the doctor hat her husband did have a seizure on last Thursday and hit his head pretty hard. Pt has had a headache since then. Wife is wanting to know if the doctor can give approval to call the medicine in sooner.

## 2021-07-22 NOTE — Telephone Encounter (Signed)
Pt's wife called, will be leaving for work art 2 pm Could she call me back before 2 pm

## 2021-07-23 ENCOUNTER — Telehealth: Payer: Self-pay | Admitting: Neurology

## 2021-07-23 MED ORDER — LACOSAMIDE 200 MG PO TABS: 400.0000 mg | ORAL_TABLET | Freq: Every evening | ORAL | 0 refills | Status: DC

## 2021-07-23 MED ORDER — LACOSAMIDE 200 MG PO TABS: 200.0000 mg | ORAL_TABLET | Freq: Every evening | ORAL | 0 refills | Status: DC

## 2021-07-23 NOTE — Telephone Encounter (Signed)
Spoke with spouse regarding antiseizure medications.  Patient kept complaining the Keppra makes him sleepy, the lamotrigine make him sleepy and itchy and the gabapentin makes him itchy.  He reports taking Benadryl all the time.  He is very frustrated with the medications.  I will discontinue both levetiracetam and lamotrigine because I do not believe that he will be able to continue with the titration up for 6 weeks up to 100 mg twice daily.  I will switch him to Vimpat 200 mg at night.  Windell Norfolk, MD

## 2021-07-25 ENCOUNTER — Telehealth: Payer: Self-pay

## 2021-07-25 NOTE — Telephone Encounter (Signed)
We received a call from this patient today asking to speak with one of the nurses. All nurses were unavailable so  the patient asked for a call back. He stated he was started on new medication, lacosamide (VIMPAT) 200 MG TABS tablet, the patient states he has been up all night itching due to the medication.   Can you please call this patient back to discuss the medication?  Thanks!

## 2021-08-06 ENCOUNTER — Telehealth: Payer: Self-pay | Admitting: Neurology

## 2021-08-06 NOTE — Telephone Encounter (Signed)
08/06/2021 1213: Left a message for spouse Clara to continue Vimpat 200 mg nightly and to monitor him to itching/rash. Can try Benadryl for the itching.   Windell Norfolk, MD

## 2021-08-06 NOTE — Telephone Encounter (Signed)
Left a message with spouse to continue with Vimpat as scheduled and to call for any additional concerns.

## 2021-08-08 ENCOUNTER — Ambulatory Visit: Payer: Medicare Other | Admitting: Neurology

## 2021-08-23 ENCOUNTER — Ambulatory Visit: Payer: Medicare Other | Admitting: Family Medicine

## 2021-09-07 ENCOUNTER — Emergency Department (HOSPITAL_BASED_OUTPATIENT_CLINIC_OR_DEPARTMENT_OTHER)
Admission: EM | Admit: 2021-09-07 | Discharge: 2021-09-07 | Disposition: A | Discharge status: Home / Self Care | Payer: Medicare Other | Attending: Emergency Medicine | Admitting: Emergency Medicine

## 2021-09-07 ENCOUNTER — Encounter (HOSPITAL_BASED_OUTPATIENT_CLINIC_OR_DEPARTMENT_OTHER): Payer: Self-pay | Admitting: Emergency Medicine

## 2021-09-07 ENCOUNTER — Other Ambulatory Visit: Payer: Self-pay

## 2021-09-07 DIAGNOSIS — T50B95A Adverse effect of other viral vaccines, initial encounter: Secondary | ICD-10-CM

## 2021-09-07 DIAGNOSIS — F17210 Nicotine dependence, cigarettes, uncomplicated: Secondary | ICD-10-CM | POA: Insufficient documentation

## 2021-09-07 DIAGNOSIS — M79604 Pain in right leg: Secondary | ICD-10-CM | POA: Diagnosis present

## 2021-09-07 DIAGNOSIS — M79601 Pain in right arm: Secondary | ICD-10-CM | POA: Insufficient documentation

## 2021-09-07 DIAGNOSIS — J3489 Other specified disorders of nose and nasal sinuses: Secondary | ICD-10-CM | POA: Insufficient documentation

## 2021-09-07 DIAGNOSIS — T50Z95A Adverse effect of other vaccines and biological substances, initial encounter: Secondary | ICD-10-CM | POA: Insufficient documentation

## 2021-09-07 DIAGNOSIS — T887XXA Unspecified adverse effect of drug or medicament, initial encounter: Secondary | ICD-10-CM | POA: Diagnosis not present

## 2021-09-07 DIAGNOSIS — M791 Myalgia, unspecified site: Secondary | ICD-10-CM | POA: Insufficient documentation

## 2021-09-07 MED ORDER — DEXAMETHASONE 4 MG PO TABS
10.0000 mg | ORAL_TABLET | Freq: Once | ORAL | Status: AC
  Administered 2021-09-07: 10 mg via ORAL
  Filled 2021-09-07: qty 3

## 2021-09-07 MED ORDER — KETOROLAC TROMETHAMINE 15 MG/ML IJ SOLN
15.0000 mg | Freq: Once | INTRAMUSCULAR | Status: AC
  Administered 2021-09-07: 15 mg via INTRAMUSCULAR
  Filled 2021-09-07: qty 1

## 2021-09-07 NOTE — ED Provider Notes (Signed)
[facility] EMERGENCY DEPT Provider Note   CSN: [id number] Arrival date & time: 09/07/21  1939     History No chief complaint on file.   Jeffery Simmons is a 60 y.o. male.  60 yo M with a cc of R arm and leg pain. Got his covid booster.  Family wise he thinks it is related to his COVID vaccination.  He has had a bit of rhinorrhea after that.  Feels like the pain is worse when he lays on the right side or moves his right arm and leg.  Feels like they are little bit more heavy than they normally are.  Denies any weakness to the area.  The history is provided by the patient.  Illness Severity:  Moderate Onset quality:  Gradual Duration:  2 days Timing:  Constant Progression:  Worsening Chronicity:  New Associated symptoms: myalgias   Associated symptoms: no abdominal pain, no chest pain, no congestion, no diarrhea, no fever, no headaches, no rash, no shortness of breath and no vomiting       Past Medical History:  Diagnosis Date   Seizures (HCC)    Seizures (HCC)    started in middle school, last several years ago    Patient Active Problem List   Diagnosis Date Noted   Kidney stone 05/03/2021   Status post placement of ureteral stent 05/03/2021   Primary osteoarthritis of both shoulders 03/21/2021   Subacromial bursitis of both shoulders 02/13/2021   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   Healthcare maintenance 12/31/2020   Androgen deficiency 12/31/2020   Seizure disorder (HCC) 05/15/2020   Tobacco use 05/15/2020    Past Surgical History:  Procedure Laterality Date   CYSTOSCOPY/URETEROSCOPY/HOLMIUM LASER/STENT PLACEMENT Left 04/08/2021   Procedure: CYSTOSCOPY/URETEROSCOPY/HOLMIUM LASER/STENT PLACEMENT;  Surgeon: Bjorn Pippin, MD;  Location: [facility];  Service: Urology;  Laterality: Left;   HEMORRHOID SURGERY     HEMORRHOID SURGERY     SHOULDER ARTHROSCOPY WITH ROTATOR CUFF REPAIR Bilateral 2019   SHOULDER OPEN ROTATOR CUFF REPAIR Bilateral 2019 2020        Family History  Problem Relation Age of Onset   Stroke Mother     Social History   Tobacco Use   Smoking status: Every Day    Packs/day: 1.00    Types: Cigarettes   Smokeless tobacco: Never  Vaping Use   Vaping Use: Never used  Substance Use Topics   Alcohol use: Never   Drug use: Never    Home Medications Prior to Admission medications   Medication Sig Start Date End Date Taking? Authorizing Provider  atorvastatin (LIPITOR) 20 MG tablet Take 1 tablet (20 mg total) by mouth daily. 05/03/21   Mliss Sax, MD  gabapentin (NEURONTIN) 100 MG capsule Take 1 capsule (100 mg total) by mouth at bedtime. 06/27/21   Windell Norfolk, MD  guaiFENesin 200 MG tablet Take 1 tablet (200 mg total) by mouth every 4 (four) hours as needed for cough or to loosen phlegm. 05/23/21   Gustavus Bryant, FNP  ketorolac (TORADOL) 10 MG tablet Take 1 tablet (10 mg total) by mouth every 6 (six) hours as needed. 05/03/21   Mliss Sax, MD  lacosamide (VIMPAT) 200 MG TABS tablet Take 1 tablet (200 mg total) by mouth at bedtime. 07/23/21 08/22/21  Windell Norfolk, MD  ondansetron (ZOFRAN ODT) 4 MG disintegrating tablet Take 1 tablet (4 mg total) by mouth every 8 (eight) hours as needed for nausea or vomiting. 04/06/21   Renne Crigler, PA-C  SUMAtriptan (  IMITREX) 50 MG tablet Take 1 tab at onset of migraine. May repeat in 2 hrs, if needed. Max dose: 2 tabs/day.This is a 30 day prescription. No early refills. 07/17/21   Windell Norfolk, MD  tamsulosin (FLOMAX) 0.4 MG CAPS capsule TAKE 1 CAPSULE(0.4 MG) BY MOUTH DAILY 05/10/21   Mliss Sax, MD    Allergies    Tylenol [acetaminophen]  Review of Systems   Review of Systems  Constitutional:  Negative for chills and fever.  HENT:  Negative for congestion and facial swelling.   Eyes:  Negative for discharge and visual disturbance.  Respiratory:  Negative for shortness of breath.   Cardiovascular:  Negative for chest pain and  palpitations.  Gastrointestinal:  Negative for abdominal pain, diarrhea and vomiting.  Musculoskeletal:  Positive for arthralgias and myalgias.  Skin:  Negative for color change and rash.  Neurological:  Negative for tremors, syncope and headaches.  Psychiatric/Behavioral:  Negative for confusion and dysphoric mood.    Physical Exam Updated Vital Signs BP 115/86    Pulse 76    Temp 98.5 F (36.9 C) (Oral)    Resp 16    Ht 5\' 9"  (1.753 m)    Wt 70.3 kg    SpO2 96%    BMI 22.89 kg/m   Physical Exam Vitals and nursing note reviewed.  Constitutional:      Appearance: He is well-developed.  HENT:     Head: Normocephalic and atraumatic.  Eyes:     Pupils: Pupils are equal, round, and reactive to light.  Neck:     Vascular: No JVD.  Cardiovascular:     Rate and Rhythm: Normal rate and regular rhythm.     Heart sounds: No murmur heard.   No friction rub. No gallop.  Pulmonary:     Effort: No respiratory distress.     Breath sounds: No wheezing.  Abdominal:     General: There is no distension.     Tenderness: There is no abdominal tenderness. There is no guarding or rebound.  Musculoskeletal:        General: Normal range of motion.     Cervical back: Normal range of motion and neck supple.  Skin:    Coloration: Skin is not pale.     Findings: No rash.  Neurological:     Mental Status: He is alert and oriented to person, place, and time.     Comments: No appreciable weakness to the right upper or right lower extremity compared to the other side.  Reflexes are 2+ and equal.  No clonus.  No midline C-spine tenderness.  No mid back pain.  Psychiatric:        Behavior: Behavior normal.    ED Results / Procedures / Treatments   Labs (all labs ordered are listed, but only abnormal results are displayed) Labs Reviewed - No data to display  EKG None  Radiology No results found.  Procedures Procedures   Medications Ordered in ED Medications  ketorolac (TORADOL) 15 MG/ML  injection 15 mg (has no administration in time range)  dexamethasone (DECADRON) tablet 10 mg (has no administration in time range)    ED Course  I have reviewed the triage vital signs and the nursing notes.  Pertinent labs & imaging results that were available during my care of the patient were reviewed by me and considered in my medical decision making (see chart for details).    MDM Rules/Calculators/A&P  60 yo M with a chief complaints of pain to the right arm and leg after having a COVID vaccination.  I cannot explain his symptoms other than a vaccine reaction.  He has no back or neck pain making these impingement syndrome unlikely.  He has no weakness, no issues with his reflexes making an atypical presentation of Guillain-Barr less likely.  He does not have any weakness on that side making stroke less likely as well.  Fourth attempt to treat his symptoms.  Have him follow-up with his family doctor.  9:19 PM:  I have discussed the diagnosis/risks/treatment options with the patient and believe the pt to be eligible for discharge home to follow-up with PCP. We also discussed returning to the ED immediately if new or worsening sx occur. We discussed the sx which are most concerning (e.g., sudden worsening pain, fever, inability to tolerate by mouth) that necessitate immediate return. Medications administered to the patient during their visit and any new prescriptions provided to the patient are listed below.  Medications given during this visit Medications  ketorolac (TORADOL) 15 MG/ML injection 15 mg (has no administration in time range)  dexamethasone (DECADRON) tablet 10 mg (has no administration in time range)     The patient appears reasonably screen and/or stabilized for discharge and I doubt any other medical condition or other [facility] requiring further screening, evaluation, or treatment in the ED at this time prior to discharge.      Final Clinical  Impression(s) / ED Diagnoses Final diagnoses:  Pain of right upper extremity  Right leg pain  Adverse effect of COVID-19 vaccine    Rx / DC Orders ED Discharge Orders     None        Melene Plan, DO 09/07/21 2119

## 2021-09-07 NOTE — ED Notes (Signed)
EDP at bedside  

## 2021-09-07 NOTE — ED Triage Notes (Signed)
Reports left sided flank pain, back pain and congestion since last night. Concerned for side effects from Pfizer COVID vaccine, received dose 12/29.

## 2021-09-07 NOTE — Discharge Instructions (Addendum)
Take 4 over the counter ibuprofen tablets 3 times a day or 2 over-the-counter naproxen tablets twice a day for pain. Also take tylenol 1000mg(2 extra strength) four times a day.    

## 2021-09-17 ENCOUNTER — Ambulatory Visit: Payer: Medicare Other | Admitting: Neurology

## 2021-09-17 ENCOUNTER — Encounter: Payer: Self-pay | Admitting: Neurology

## 2021-09-19 ENCOUNTER — Telehealth: Payer: Self-pay | Admitting: Family Medicine

## 2021-09-19 NOTE — Telephone Encounter (Signed)
Pt requested med records be faxed to [facility] Orthopedic attn Dr.Chandler @ [phone] or [phone] --glh

## 2021-10-24 ENCOUNTER — Other Ambulatory Visit: Payer: Self-pay

## 2021-10-24 ENCOUNTER — Ambulatory Visit (INDEPENDENT_AMBULATORY_CARE_PROVIDER_SITE_OTHER): Payer: Medicare HMO | Admitting: Neurology

## 2021-10-24 ENCOUNTER — Encounter: Payer: Self-pay | Admitting: Neurology

## 2021-10-24 VITALS — BP 126/90 | HR 85 | Ht 69.0 in | Wt 167.0 lb

## 2021-10-24 DIAGNOSIS — G40909 Epilepsy, unspecified, not intractable, without status epilepticus: Secondary | ICD-10-CM

## 2021-10-24 DIAGNOSIS — G8929 Other chronic pain: Secondary | ICD-10-CM

## 2021-10-24 DIAGNOSIS — R519 Headache, unspecified: Secondary | ICD-10-CM

## 2021-10-24 MED ORDER — LEVETIRACETAM ER 1000 MG PO TB24: 1000.0000 mg | ORAL_TABLET | Freq: Every evening | ORAL | 11 refills | Status: DC
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
## 2021-10-24 NOTE — Patient Instructions (Signed)
Keppra 1000 mg daily  Follow up in 6 months

## 2021-10-25 ENCOUNTER — Other Ambulatory Visit: Payer: Self-pay | Admitting: Neurology

## 2021-10-25 ENCOUNTER — Telehealth: Payer: Self-pay | Admitting: Neurology

## 2021-10-25 MED ORDER — LEVETIRACETAM 500 MG PO TABS: 500.0000 mg | ORAL_TABLET | Freq: Two times a day (BID) | ORAL | 11 refills | Status: DC

## 2021-10-25 NOTE — Progress Notes (Signed)
Levetiracetam XR not covered by insurance, will prescribed Levetiracetam 500 mg BID.  Dr. Teresa Coombs

## 2021-10-25 NOTE — Telephone Encounter (Signed)
Pt's wife called stating that the insurance wil not cover the levETIRAcetam ER 1000 MG TB24 Please advise.

## 2021-10-28 ENCOUNTER — Other Ambulatory Visit: Payer: Self-pay | Admitting: Neurology

## 2021-10-28 ENCOUNTER — Other Ambulatory Visit: Payer: Self-pay | Admitting: *Deleted

## 2021-10-28 MED ORDER — KEPPRA 500 MG PO TABS: 500.0000 mg | ORAL_TABLET | Freq: Two times a day (BID) | ORAL | 11 refills | Status: DC

## 2021-10-28 MED ORDER — LEVETIRACETAM 500 MG PO TABS: 500.0000 mg | ORAL_TABLET | Freq: Two times a day (BID) | ORAL | 11 refills | Status: DC

## 2021-10-28 NOTE — Telephone Encounter (Signed)
Yes, I would like him to take brand name Keppra 500 mg BID. I will write a new script. Thank you

## 2021-10-28 NOTE — Telephone Encounter (Signed)
Pt's wife called in stating the levETIRAcetam ER 1000 MG TB24 is making him itch to the point where he has to take Benadryl constantly. Would like to see if there is an alternative medication that he can take. Please advise.

## 2021-10-28 NOTE — Telephone Encounter (Signed)
PA approved for brand name Keppra 500mg , one tablet BID. Authorization valid through 09/07/2022.

## 2021-10-28 NOTE — Telephone Encounter (Signed)
I have called the patient's wife to let her know name brand Keppra 500mg , one tab BID, has been sent to the pharmacy.

## 2021-10-28 NOTE — Telephone Encounter (Addendum)
I spoke to the patient's wife. States [employer] would not cover Elepsia XR. He has been taking levetiracetam 500mg , one tab BID for three days. Intolerable itching with this generic medication. No rash present. He has been using Benadryl which is causing him drowsiness during the day. He did not have this issue with brand name Keppra.  Also, reported itching with lacosamide, phenytoin, divalproex.   Submitted a PA request on covermymeds (key: [id number]). Pt has coverage through [facility] (ID: [facility]). Denied because "drug manufacturer must participate in the Medicare Coverage Gap Discount Program for their drugs to be covered under Part D'. No appeal option.  Submitted PA for brand name Keppra on covermymeds (key: [id number]). Under urgent review. Decision pending.

## 2021-12-09 ENCOUNTER — Telehealth: Payer: Self-pay | Admitting: Family Medicine

## 2021-12-09 NOTE — Telephone Encounter (Signed)
Left message for patient to call back and schedule Medicare Annual Wellness Visit (AWV) in office.  ° °If not able to come in office, please offer to do virtually or by telephone.  Left office number and my jabber #[phone]. ° °Due for AWVI ° °Please schedule at anytime with Nurse Health Advisor. °  °

## 2021-12-09 NOTE — Telephone Encounter (Signed)
Pt said the times dont work for him to schedule, He said he will have to check with his supervisor for work ?

## 2022-01-21 ENCOUNTER — Telehealth: Payer: Self-pay | Admitting: Neurology

## 2022-01-21 NOTE — Telephone Encounter (Signed)
Mr. Decarolis had tried multiple ASM including Dilantin, Depakote, levetiracetam, lacosamide, and lamotrigine and they all make him itch therefore he would self discontinue it. Last medication we tried was Brand Keppra but it seems like he also self discontinued it.  ?Will consider Trileptal and Aptiom in the future.  ? ?Agree with recommendations above.  ? ?Dr. April Manson

## 2022-01-21 NOTE — Telephone Encounter (Signed)
I spoke to the patient's wife. Reports witnessed seizure while at work today (eyes rolled, tremors in arms, stiffness in legs). Estimated it lasting 20 minutes. Fatigued but back to his baseline. The patient would not go to the ED. Stopped taking his Keppra 3-4 weeks ago. Says "everything makes me itch" and says he works different hours each week. He is taking regular Benadryl which helps but makes him drowsy. The patient and his wife have been informed of the following: ? ?1) Must take Keppra as prescribed without missing doses. Suggested setting an alarm on his phone or having his wife remind him when due. ?2) [place] law prohibits driving until six months seizure free. ?3) He may try non-drowsy Benadryl to help with his itching but decrease the side effect of drowsiness. ?4) He needs to get enough sleep, despite his changing work hours.  ?5) He should proceed to ED for any seizures lasing greater than five minutes. ?6) Call our office to report any further seizures. ?7) Keep pending follow up appt.  ? ?Dr. April Manson has been informed of this phone call. ?

## 2022-01-21 NOTE — Telephone Encounter (Signed)
Pt's wife has called to report pt was just picked up from work by wife as a result of having a seizure lasting 20-25 mins.  Wife states pt has declined going to ED.  Wife was offered Dr Karie Georges next available office visit, it was declined.  Wife is asking for a  call back.  Pt's # can be called ?

## 2022-04-23 ENCOUNTER — Ambulatory Visit: Payer: Medicare HMO | Admitting: Neurology

## 2022-06-09 ENCOUNTER — Encounter (HOSPITAL_COMMUNITY): Payer: Self-pay

## 2022-06-09 ENCOUNTER — Emergency Department (HOSPITAL_COMMUNITY): Payer: Medicare HMO

## 2022-06-09 ENCOUNTER — Other Ambulatory Visit: Payer: Self-pay

## 2022-06-09 ENCOUNTER — Emergency Department (HOSPITAL_COMMUNITY)
Admission: EM | Admit: 2022-06-09 | Discharge: 2022-06-09 | Discharge status: Against Medical Advice | Payer: Medicare HMO | Attending: Emergency Medicine | Admitting: Emergency Medicine

## 2022-06-09 DIAGNOSIS — R109 Unspecified abdominal pain: Secondary | ICD-10-CM | POA: Diagnosis not present

## 2022-06-09 DIAGNOSIS — G40909 Epilepsy, unspecified, not intractable, without status epilepticus: Secondary | ICD-10-CM | POA: Insufficient documentation

## 2022-06-09 DIAGNOSIS — R569 Unspecified convulsions: Secondary | ICD-10-CM | POA: Diagnosis present

## 2022-06-09 HISTORY — DX: Personal history of other diseases of the nervous system and sense organs: Z86.69

## 2022-06-09 LAB — URINALYSIS, ROUTINE W REFLEX MICROSCOPIC
Bilirubin Urine: NEGATIVE
Glucose, UA: NEGATIVE mg/dL
Hgb urine dipstick: NEGATIVE
Ketones, ur: NEGATIVE mg/dL
Leukocytes,Ua: NEGATIVE
Nitrite: NEGATIVE
Protein, ur: NEGATIVE mg/dL
Specific Gravity, Urine: 1.005 (ref 1.005–1.030)
pH: 6 (ref 5.0–8.0)

## 2022-06-09 LAB — CBG MONITORING, ED: Glucose-Capillary: 93 mg/dL (ref 70–99)

## 2022-06-09 MED ORDER — LEVETIRACETAM IN NACL 1000 MG/100ML IV SOLN: 1000.0000 mg | Freq: Once | INTRAVENOUS | Status: DC

## 2022-06-09 NOTE — ED Provider Notes (Signed)
[facility]-EMERGENCY DEPT Provider Note   CSN: [id number] Arrival date & time: 06/09/22  1513     History  Chief Complaint  Patient presents with   Seizures    Jeffery Simmons is a 61 y.o. male.  Patient is a 61 year old who presents after having a seizure.  He does have a history of seizures.  He is currently on Keppra although he has a history of noncompliance.  He reports that he missed his this recent dose but has been taking it other than that.  The seizure was witnessed by medical personnel when he was at his doctor's office.  His wife states that he had 4 seizure like activity that lasted overall about 10 to 15 minutes.  He denies any injuries.  He reports that he did not hit his head.  His wife states that they caught him and he did not fall or have injuries.  He reports it was a typical type seizure for him.  He does have a headache that he says is common when he has had a seizure.  He says it is his typical headache and there is no unusual symptoms today.  He had gone to the doctor because he has been having some pain in his left flank area.  He said he had it a month ago and then it came back last night.  Is otherwise nonradiating.  He denies any hematuria.  No nausea or vomiting.  No fevers.  He said he had a kidney stone that felt similar.       Home Medications Prior to Admission medications   Medication Sig Start Date End Date Taking? Authorizing Provider  atorvastatin (LIPITOR) 20 MG tablet Take 1 tablet (20 mg total) by mouth daily. 05/03/21   Mliss Sax, MD  gabapentin (NEURONTIN) 100 MG capsule Take 1 capsule (100 mg total) by mouth at bedtime. 06/27/21   Windell Norfolk, MD  guaiFENesin 200 MG tablet Take 1 tablet (200 mg total) by mouth every 4 (four) hours as needed for cough or to loosen phlegm. 05/23/21   Mound, Acie Fredrickson, FNP  KEPPRA 500 MG tablet Take 1 tablet (500 mg total) by mouth 2 (two) times daily. 10/28/21 11/27/21  Windell Norfolk, MD  ketorolac (TORADOL) 10 MG tablet Take 1 tablet (10 mg total) by mouth every 6 (six) hours as needed. 05/03/21   Mliss Sax, MD  ondansetron (ZOFRAN ODT) 4 MG disintegrating tablet Take 1 tablet (4 mg total) by mouth every 8 (eight) hours as needed for nausea or vomiting. 04/06/21   Renne Crigler, PA-C  tamsulosin (FLOMAX) 0.4 MG CAPS capsule TAKE 1 CAPSULE(0.4 MG) BY MOUTH DAILY 05/10/21   Mliss Sax, MD      Allergies    Tylenol [acetaminophen] and Levetiracetam    Review of Systems   Review of Systems  Constitutional:  Negative for chills, diaphoresis, fatigue and fever.  HENT:  Negative for congestion, rhinorrhea and sneezing.   Eyes: Negative.   Respiratory:  Negative for cough, chest tightness and shortness of breath.   Cardiovascular:  Negative for chest pain and leg swelling.  Gastrointestinal:  Positive for abdominal pain. Negative for blood in stool, diarrhea, nausea and vomiting.  Genitourinary:  Negative for difficulty urinating, flank pain, frequency and hematuria.  Musculoskeletal:  Negative for arthralgias and back pain.  Skin:  Negative for rash.  Neurological:  Positive for seizures and headaches. Negative for dizziness, speech difficulty, weakness and numbness.  Physical Exam Updated Vital Signs BP (!) 134/120   Pulse 65   Temp 98.2 F (36.8 C) (Oral)   Resp 20   Ht 5\' 9"  (1.753 m)   Wt 74.4 kg   SpO2 100%   BMI 24.22 kg/m  Physical Exam  ED Results / Procedures / Treatments   Labs (all labs ordered are listed, but only abnormal results are displayed) Labs Reviewed  URINALYSIS, ROUTINE W REFLEX MICROSCOPIC - Abnormal; Notable for the following components:      Result Value   APPearance HAZY (*)    All other components within normal limits  BASIC METABOLIC PANEL  CBC WITH DIFFERENTIAL/PLATELET  CBG MONITORING, ED    EKG EKG Interpretation  Date/Time:  Monday June 09 2022 15:38:08 EDT Ventricular Rate:  62 PR  Interval:  146 QRS Duration: 90 QT Interval:  412 QTC Calculation: 419 R Axis:   29 Text Interpretation: Sinus or ectopic atrial rhythm Consider left ventricular hypertrophy Minimal ST elevation, inferior leads since last tracing no significant change Confirmed by Malvin Johns [phone]) on 06/09/2022 4:39:10 PM  Radiology No results found.  Procedures Procedures    Medications Ordered in ED Medications  levETIRAcetam (KEPPRA) IVPB 1000 mg/100 mL premix (has no administration in time range)    ED Course/ Medical Decision Making/ A&P                           Medical Decision Making Amount and/or Complexity of Data Reviewed Labs: ordered. Radiology: ordered.  Risk Prescription drug management.   Patient is a 61 year old who presents after having a seizure in his doctor's office.  He also presents with some left flank pain and is concerned about possible kidney stone.  When I initially saw him, he said he really needed to leave.  He initially did not want to be seen and said he needed to leave because his ride was here.  However after talking to him, he was convinced to stay and get some blood work checked and a dose of IV Keppra and CT scan to assess for kidney stone.  He did have a urinalysis which shows no signs of infection or hematuria.  However during the course of trying to obtain an IV and getting a CT scan, patient decided to leave AMA.  He left prior to further discussion or discharge papers.  Final Clinical Impression(s) / ED Diagnoses Final diagnoses:  Seizure ([place])  Flank pain    Rx / DC Orders ED Discharge Orders     None         Malvin Johns, MD 06/09/22 1947

## 2022-06-09 NOTE — ED Notes (Signed)
IV attempt unsuccessful x 2. Patient verbally aggressive prior to my first attempt. Stated, "you only have 2 times to stick me. You don't know, I am not going to be stuck that much, how many times do you stick people". Spouse at bedside.  Patient refused to have someone else attempt IV. Awaiting on ultrasound qualified RN to attempt IV while waiting on IV team consult. Charge nurse and provider informed.

## 2022-06-09 NOTE — ED Triage Notes (Signed)
"  PT went to PCP office for LLQ pain x1wk, feels like when he had a kidney stone last year but no other urinary symptoms. Collapsed when going to imaging and started to have seizures. HX of seizures and admits to missing seizure meds last night." Per EMS  CBG 93

## 2022-06-09 NOTE — ED Notes (Signed)
A&O x 4. Ambulatory in room with steady gait. No signs of distress. VSS. Seizure pads on bed rails for safety.

## 2022-06-09 NOTE — ED Notes (Signed)
Patient's spouse came to nursing station and requested he be unhooked from the monitor to leave AMA. I went in and explained the risk of further health problems leading up to possible death if he left AMA. Encouraged patient to stay and complete treatment. Provider informed.  Patient left AMA with spouse. GCS 15, ambulatory with a steady gait.

## 2022-06-09 NOTE — ED Triage Notes (Addendum)
"  Pt having back to back seizures and states pt side is really hurting him. He had kidney stone removed last year. Pt has lost weight and is seeing a neurologist for his headaches. Seizure lasted for 1 min- 77mins" per wife.

## 2022-06-10 ENCOUNTER — Telehealth: Payer: Self-pay

## 2022-06-10 NOTE — Telephone Encounter (Signed)
Spoke with wife today. She states this Jeffery Simmons PCP is at [facility].

## 2022-06-20 IMAGING — CT CT RENAL STONE PROTOCOL
2 of 4 series · 16 of 46 positions shown, 18 images · non-contrast
Comparison: None.

CLINICAL DATA: Left flank pain, hematuria.  Is is

EXAM:
CT ABDOMEN AND PELVIS WITHOUT CONTRAST
TECHNIQUE: Multidetector CT imaging of the abdomen and pelvis was performed
following the standard protocol without IV contrast.

[Series 2: axial st · axial · 0.83mm/px · z∈[-478,-63]mm · 13 of 94 slices shown, 15 images]
[im 7/94  soft-tissue]
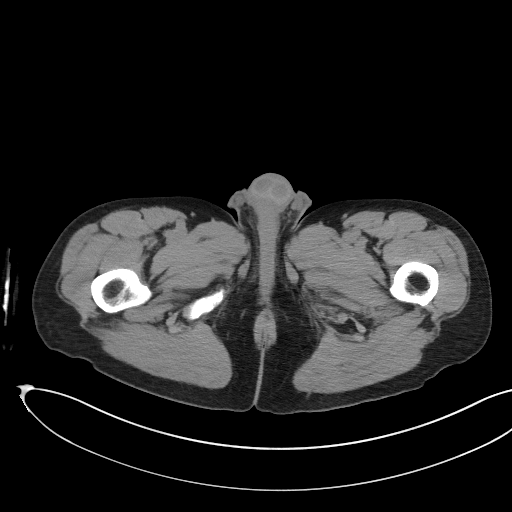
[im 7/94  bone]
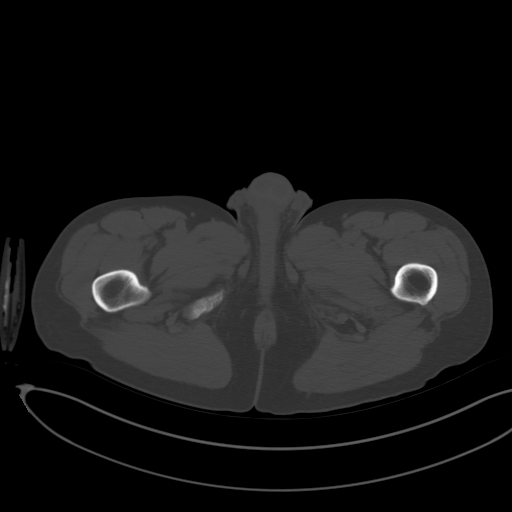
[im 14/94  soft-tissue]
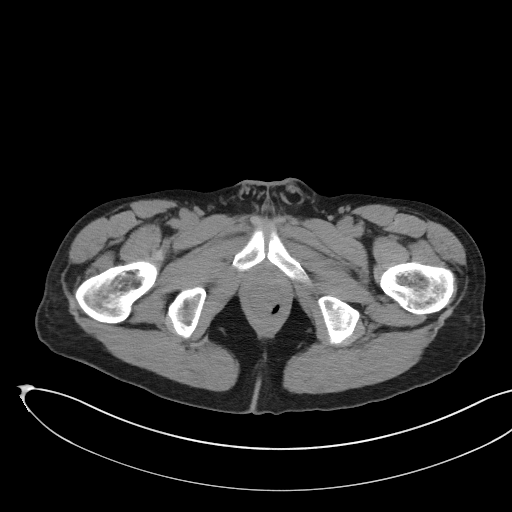
[im 21/94  soft-tissue]
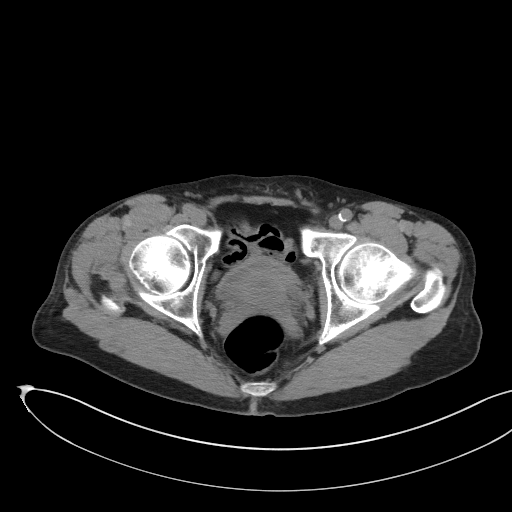
[im 28/94  soft-tissue]
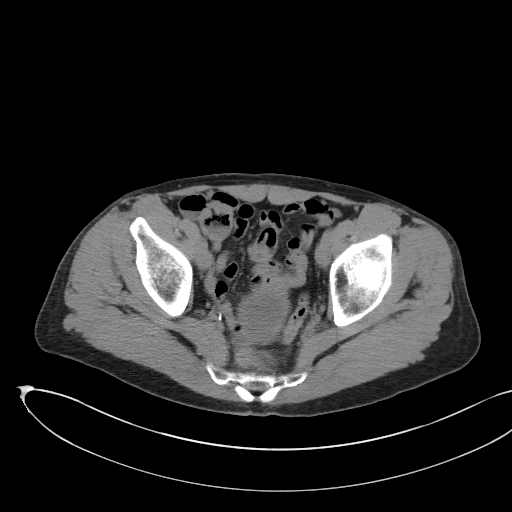
[im 35/94  soft-tissue]
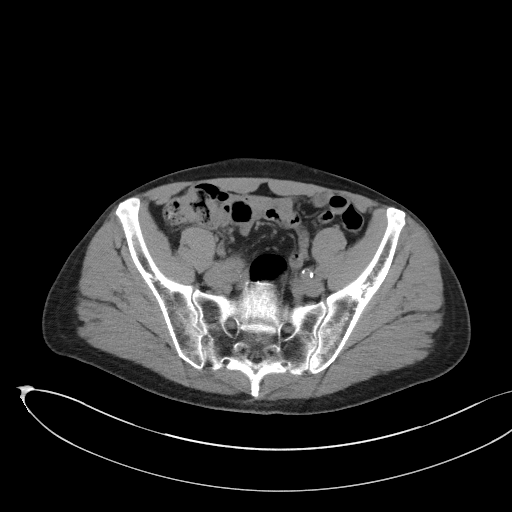
[im 42/94  soft-tissue]
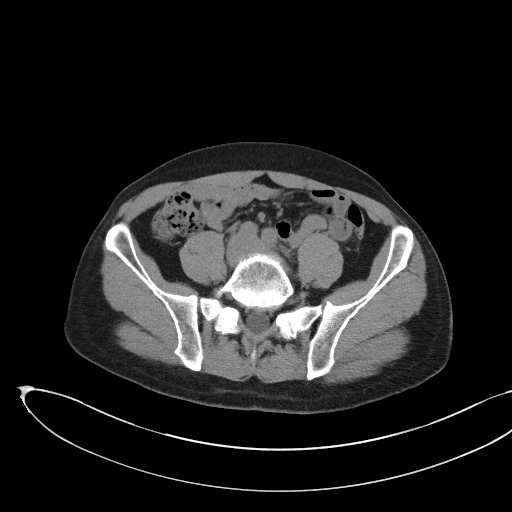
[im 49/94  soft-tissue]
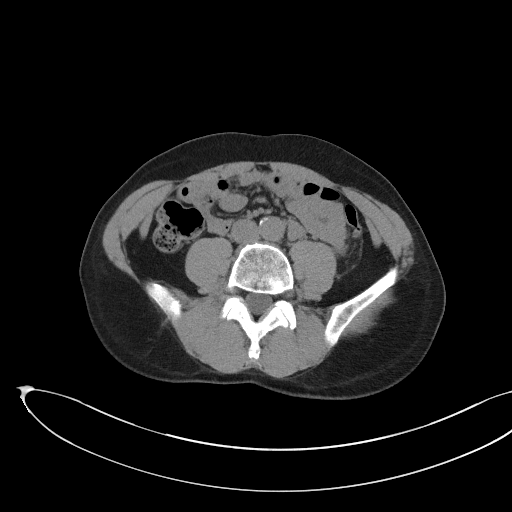
[im 56/94  soft-tissue]
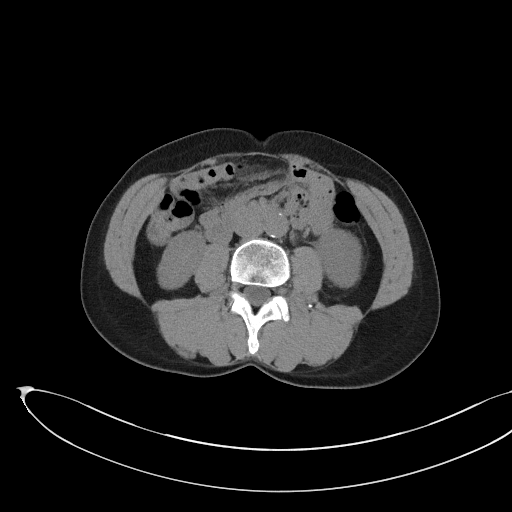
[im 63/94  soft-tissue]
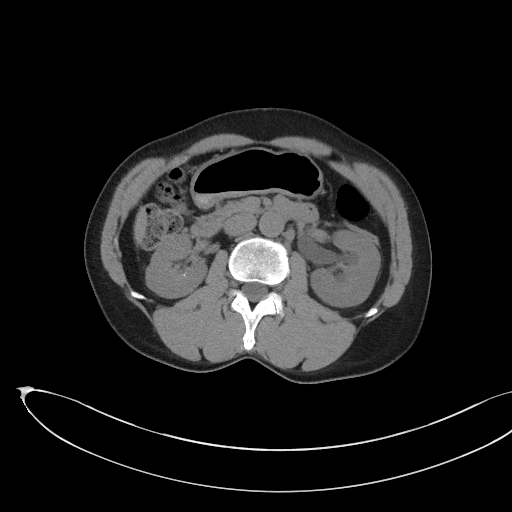
[im 63/94  bone]
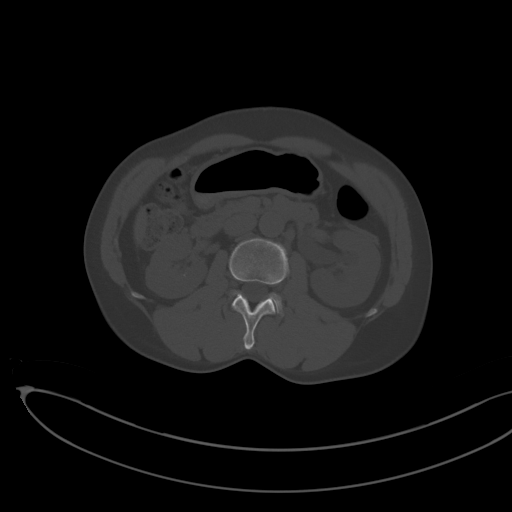
[im 69/94  soft-tissue]
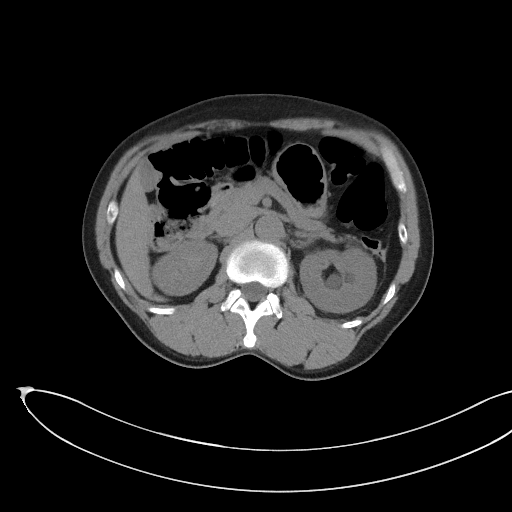
[im 76/94  soft-tissue]
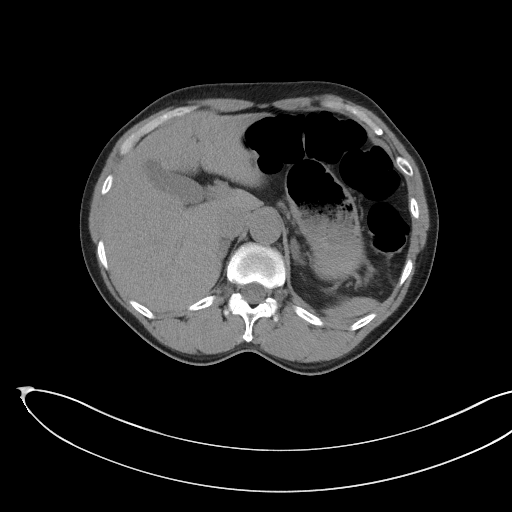
[im 83/94  soft-tissue]
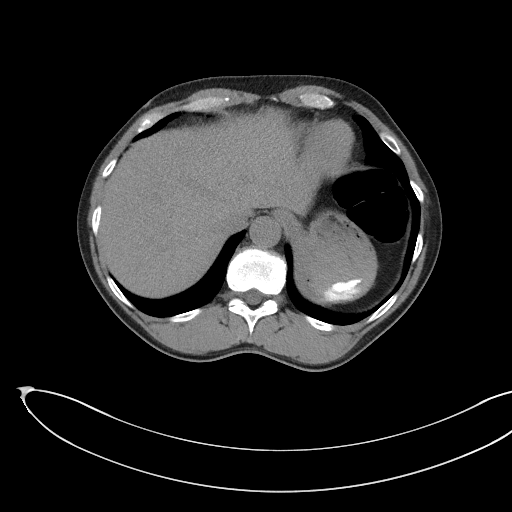
[im 90/94  soft-tissue]
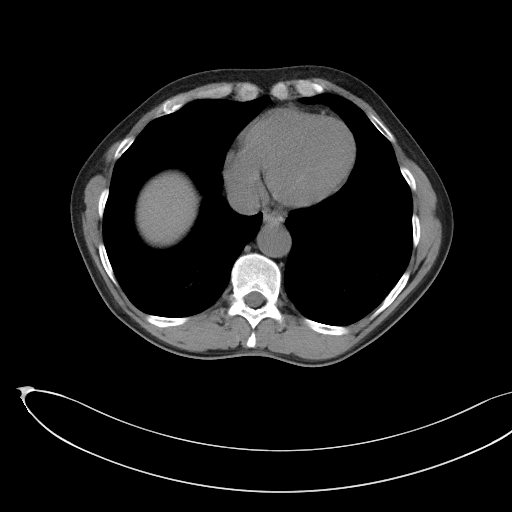

[Series 5: coronal st · coronal · 0.76mm/px · 3 of 95 slices shown]
[im 32/95  soft-tissue]
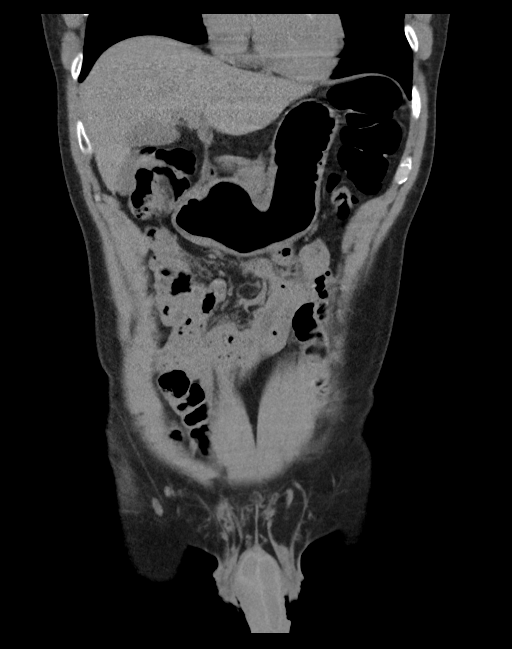
[im 42/95  soft-tissue]
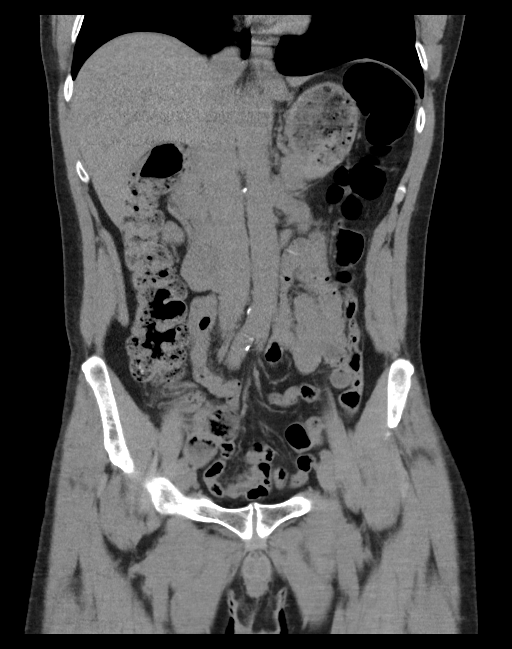
[im 53/95  soft-tissue]
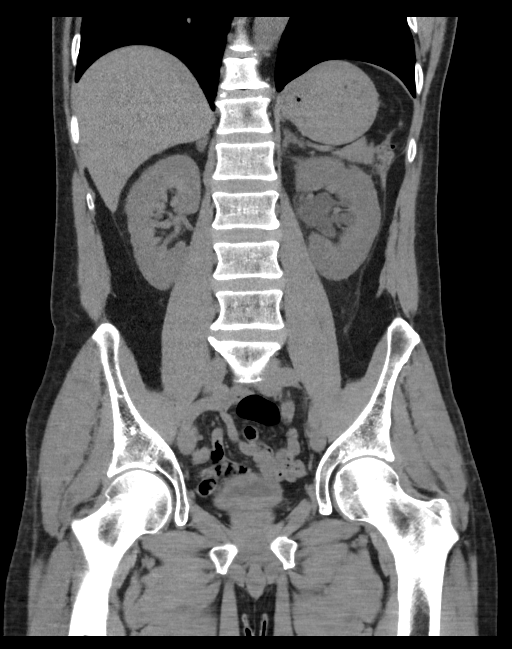

[16 of 46 positions shown; findings below may reference images not displayed]

FINDINGS: Lower chest: No acute abnormality.

Hepatobiliary: No focal liver abnormality is seen. Normal appearance
of the gallbladder.

Pancreas: Unremarkable. No surrounding inflammatory changes.

Spleen: Normal in size without focal abnormality.

Adrenals/Urinary Tract: Adrenal glands are unremarkable. There are a
few punctate right renal calculi. No hydronephrosis. There is
moderate left hydronephrosis secondary to a 7 mm calculus in the
proximal left ureter. There is an additional small calculus in the
left kidney. Urinary bladder is unremarkable.

Stomach/Bowel: Stomach is within normal limits. Appendix not well
visualized. No evidence of bowel wall thickening, distention, or
inflammatory changes.

Vascular/Lymphatic: Aortic atherosclerosis. Vascular patency cannot
be assessed in the absence of IV contrast. Post no enlarged
abdominal or pelvic lymph nodes.

Reproductive: Prostate is borderline enlarged.

Other: No abdominal wall hernia or abnormality. No abdominopelvic
ascites.

Musculoskeletal: No acute or significant osseous findings.
IMPRESSION: 1. Moderate left hydronephrosis secondary to a 7 mm calculus in the
proximal left ureter.
2. Nonobstructing right renal calculi.
3. Aortic atherosclerosis.

Aortic Atherosclerosis (KNBAR-DYO.O).

## 2022-12-25 ENCOUNTER — Encounter: Payer: Self-pay | Admitting: *Deleted

## 2023-01-05 LAB — COLOGUARD: COLOGUARD: POSITIVE — AB

## 2023-02-05 ENCOUNTER — Ambulatory Visit (AMBULATORY_SURGERY_CENTER): Payer: Medicare HMO

## 2023-02-05 VITALS — Ht 69.0 in | Wt 178.0 lb

## 2023-02-05 DIAGNOSIS — Z1211 Encounter for screening for malignant neoplasm of colon: Secondary | ICD-10-CM

## 2023-02-05 MED ORDER — PEG 3350-KCL-NA BICARB-NACL 420 G PO SOLR: 4000.0000 mL | Freq: Once | ORAL | 0 refills | Status: AC

## 2023-02-05 NOTE — Progress Notes (Signed)
No egg or soy allergy known to patient  No issues known to pt with past sedation with any surgeries or procedures - anything with acetaminophen causes serve itching pt ask that he not receive any drugs containing tylenol  Patient denies ever being told they had issues or difficulty with intubation  No FH of Malignant Hyperthermia Pt is not on diet pills Pt is not on  home 02  Pt is not on blood thinners  Pt denies issues with constipation  No A fib or A flutter Have any cardiac testing pending--no Pt is ambulatory   Patient's chart reviewed by Cathlyn Parsons CNRA prior to previsit and patient appropriate for the [facility].  Previsit completed and red dot placed by patient's name on their procedure day (on provider's schedule).      PV completed with patient. Medical history and medications updated and reviewed.  Pt requested his prep instructions just be sent to his address and he will review them with his wife. Pt understands it is his responsibility to read all the highlighted areas on his instructions when he receives his packet in the mail and to call the office if he has questions or need any clarification. Rx sent to preferred pharmacy patient is aware he will need to pick it up.

## 2023-02-16 ENCOUNTER — Telehealth: Payer: Self-pay | Admitting: Gastroenterology

## 2023-02-16 ENCOUNTER — Telehealth: Payer: Self-pay

## 2023-02-16 NOTE — Telephone Encounter (Signed)
Please give patient a call to f/u on prep instructions.   Thank you

## 2023-02-16 NOTE — Telephone Encounter (Signed)
Called patient and updated new prep instructions. Printed and mailed new instructions

## 2023-02-16 NOTE — Telephone Encounter (Signed)
Spoke with patient and went over prep instruction with new dates and times. Patient verbalized understanding of new instructions.  Printed and mailed out prep instructions.

## 2023-02-19 ENCOUNTER — Encounter: Payer: Medicare HMO | Admitting: Gastroenterology

## 2023-03-11 ENCOUNTER — Encounter: Payer: Medicare HMO | Admitting: Gastroenterology

## 2023-03-11 ENCOUNTER — Telehealth: Payer: Self-pay | Admitting: Gastroenterology

## 2023-03-11 NOTE — Telephone Encounter (Signed)
Spoke with patient.  He says that he drank all of the prep and is still having solid stool mixed with liquid.  I explained that he will continue to have BMs and he could possibly be cleaned out enough for his procedure. He states that he has already cancelled his transportation and does not want to proceed with procedure today.  He asked that we call him back to reschedule next week.

## 2023-03-11 NOTE — Telephone Encounter (Signed)
PT just called to cancel colonoscopy today at 4pm because the prep is not working correctly

## 2023-03-11 NOTE — Telephone Encounter (Signed)
Sorry to hear this. Agree he should not have cancelled his transportation without calling us, he may likely have been okay or we could have added more prep.   He unfortunately should have a cancellation fee for late cancellation.  When he does rebook I would use a double prep if he is concerned that the prep today did not clear him out enough.

## 2023-04-11 ENCOUNTER — Other Ambulatory Visit: Payer: Self-pay | Admitting: Neurology

## 2023-04-22 ENCOUNTER — Encounter: Payer: Self-pay | Admitting: Gastroenterology

## 2023-05-29 ENCOUNTER — Other Ambulatory Visit: Payer: Self-pay

## 2023-05-29 ENCOUNTER — Encounter (HOSPITAL_COMMUNITY): Payer: Self-pay

## 2023-05-29 ENCOUNTER — Emergency Department (HOSPITAL_COMMUNITY): Payer: Medicare HMO

## 2023-05-29 ENCOUNTER — Emergency Department (HOSPITAL_COMMUNITY)
Admission: EM | Admit: 2023-05-29 | Discharge: 2023-05-29 | Disposition: A | Discharge status: Home / Self Care | Payer: Medicare HMO | Attending: Emergency Medicine | Admitting: Emergency Medicine

## 2023-05-29 DIAGNOSIS — R569 Unspecified convulsions: Secondary | ICD-10-CM | POA: Diagnosis present

## 2023-05-29 DIAGNOSIS — R109 Unspecified abdominal pain: Secondary | ICD-10-CM | POA: Insufficient documentation

## 2023-05-29 DIAGNOSIS — R61 Generalized hyperhidrosis: Secondary | ICD-10-CM | POA: Diagnosis not present

## 2023-05-29 DIAGNOSIS — Z87442 Personal history of urinary calculi: Secondary | ICD-10-CM | POA: Diagnosis not present

## 2023-05-29 LAB — RAPID URINE DRUG SCREEN, HOSP PERFORMED
Amphetamines: NOT DETECTED
Barbiturates: NOT DETECTED
Benzodiazepines: NOT DETECTED
Cocaine: NOT DETECTED
Opiates: NOT DETECTED
Tetrahydrocannabinol: POSITIVE — AB

## 2023-05-29 LAB — BASIC METABOLIC PANEL
Anion gap: 10 (ref 5–15)
BUN: 20 mg/dL (ref 8–23)
CO2: 23 mmol/L (ref 22–32)
Calcium: 9.7 mg/dL (ref 8.9–10.3)
Chloride: 104 mmol/L (ref 98–111)
Creatinine, Ser: 1.39 mg/dL — ABNORMAL HIGH (ref 0.61–1.24)
GFR, Estimated: 58 mL/min — ABNORMAL LOW (ref >60)
Glucose, Bld: 96 mg/dL (ref 70–99)
Potassium: 4.4 mmol/L (ref 3.5–5.1)
Sodium: 137 mmol/L (ref 135–145)

## 2023-05-29 LAB — CBC WITH DIFFERENTIAL/PLATELET
Abs Immature Granulocytes: 0.06 10*3/uL (ref 0.00–0.07)
Basophils Absolute: 0 10*3/uL (ref 0.0–0.1)
Basophils Relative: 1 %
Eosinophils Absolute: 0 10*3/uL (ref 0.0–0.5)
Eosinophils Relative: 0 %
HCT: 47.8 % (ref 39.0–52.0)
Hemoglobin: 14.9 g/dL (ref 13.0–17.0)
Immature Granulocytes: 1 %
Lymphocytes Relative: 16 %
Lymphs Abs: 1 10*3/uL (ref 0.7–4.0)
MCH: 29.2 pg (ref 26.0–34.0)
MCHC: 31.2 g/dL (ref 30.0–36.0)
MCV: 93.5 fL (ref 80.0–100.0)
Monocytes Absolute: 0.6 10*3/uL (ref 0.1–1.0)
Monocytes Relative: 9 %
Neutro Abs: 4.7 10*3/uL (ref 1.7–7.7)
Neutrophils Relative %: 73 %
Platelets: 158 10*3/uL (ref 150–400)
RBC: 5.11 MIL/uL (ref 4.22–5.81)
RDW: 15.1 % (ref 11.5–15.5)
WBC: 6.4 10*3/uL (ref 4.0–10.5)
nRBC: 0 % (ref 0.0–0.2)

## 2023-05-29 LAB — URINALYSIS, ROUTINE W REFLEX MICROSCOPIC
Bilirubin Urine: NEGATIVE
Glucose, UA: NEGATIVE mg/dL
Hgb urine dipstick: NEGATIVE
Ketones, ur: 5 mg/dL — AB
Leukocytes,Ua: NEGATIVE
Nitrite: NEGATIVE
Protein, ur: NEGATIVE mg/dL
Specific Gravity, Urine: 1.015 (ref 1.005–1.030)
pH: 5 (ref 5.0–8.0)

## 2023-05-29 LAB — MAGNESIUM: Magnesium: 2.2 mg/dL (ref 1.7–2.4)

## 2023-05-29 LAB — CBG MONITORING, ED: Glucose-Capillary: 113 mg/dL — ABNORMAL HIGH (ref 70–99)

## 2023-05-29 MED ORDER — SODIUM CHLORIDE 0.9 % IV SOLN: INTRAVENOUS | Status: DC

## 2023-05-29 MED ORDER — ONDANSETRON HCL 4 MG/2ML IJ SOLN
4.0000 mg | Freq: Once | INTRAMUSCULAR | Status: AC
  Administered 2023-05-29: 4 mg via INTRAVENOUS
  Filled 2023-05-29: qty 2

## 2023-05-29 MED ORDER — LEVETIRACETAM IN NACL 1000 MG/100ML IV SOLN
1000.0000 mg | Freq: Once | INTRAVENOUS | Status: AC
  Administered 2023-05-29: 1000 mg via INTRAVENOUS
  Filled 2023-05-29: qty 100

## 2023-05-29 NOTE — ED Provider Notes (Addendum)
[place] EMERGENCY DEPARTMENT AT [facility] Provider Note   CSN: [id number] Arrival date & time: 05/29/23  1011     History  Chief Complaint  Patient presents with   Seizures    Jeffery Simmons is a 62 y.o. male.  Patient brought in by EMS.  Status post seizure while at work.  However preceding the seizure patient got very diaphoretic had some right flank pain does have a history of kidney stones.  Vomited twice.  And then went on to have a seizure.  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  Patient has a known history of seizures.  Patient on Keppra twice a day.  Does admit that he missed a dose last evening.  Patient was last seen for seizures a year ago.  At that point in time he had missed a dose as well.  Patient is a difficult IV stick.  So recommended to nursing that we use ultrasound to get the IV.  Patient feeling somewhat back to normal.  But not quite right he thinks it may be the kidney stone.  Patient last seen in the emerged part for seizure in October 2023.  Patient states he had an outpatient study done on for the persistent kidney stone.  As if it was a CT scan.  Patient does not remember but states that today there was 2 witnessed seizures.  Patient has a baseline history of stuttering.  Past medical history send in Box for the history of seizures started in middle school.  History of migraine headaches as well.  Patient is an everyday smoker.       Home Medications Prior to Admission medications   Medication Sig Start Date End Date Taking? Authorizing Provider  ASPIRIN LOW DOSE 81 MG tablet Take 81 mg by mouth daily. 09/17/22   [provider]  atorvastatin (LIPITOR) 20 MG tablet Take 1 tablet (20 mg total) by mouth daily. 05/03/21   Mliss Sax, MD  fluticasone [facility]) 50 MCG/ACT nasal spray Place 2 sprays into both nostrils daily. 12/14/22   [provider]   gabapentin (NEURONTIN) 100 MG capsule Take 1 capsule (100 mg total) by mouth at bedtime. Patient not taking: Reported on 02/05/2023 06/27/21   Windell Norfolk, MD  guaiFENesin 200 MG tablet Take 1 tablet (200 mg total) by mouth every 4 (four) hours as needed for cough or to loosen phlegm. 05/23/21   Gustavus Bryant, FNP  ketorolac (TORADOL) 10 MG tablet Take 1 tablet (10 mg total) by mouth every 6 (six) hours as needed. 05/03/21   Mliss Sax, MD  levETIRAcetam (KEPPRA) 500 MG tablet Take 1 tablet (500 mg total) by mouth 2 (two) times daily. APPOINTMENT NEEDED FOR FURTHER REFILLS 04/13/23   Windell Norfolk, MD  ondansetron (ZOFRAN ODT) 4 MG disintegrating tablet Take 1 tablet (4 mg total) by mouth every 8 (eight) hours as needed for nausea or vomiting. 04/06/21   Renne Crigler, PA-C  tamsulosin (FLOMAX) 0.4 MG CAPS capsule TAKE 1 CAPSULE(0.4 MG) BY MOUTH DAILY 05/10/21   Mliss Sax, MD      Allergies    Tylenol [acetaminophen] and Levetiracetam    Review of Systems   Review of Systems  Constitutional:  Positive for diaphoresis. Negative for chills and fever.  HENT:  Negative for ear pain and sore throat.   Eyes:  Negative for pain and visual disturbance.  Respiratory:  Negative for cough  and shortness of breath.   Cardiovascular:  Negative for chest pain and palpitations.  Gastrointestinal:  Positive for nausea and vomiting. Negative for abdominal pain.  Genitourinary:  Negative for dysuria and hematuria.  Musculoskeletal:  Negative for arthralgias and back pain.  Skin:  Negative for color change and rash.  Neurological:  Positive for seizures and syncope.  All other systems reviewed and are negative.   Physical Exam Updated Vital Signs BP 112/78   Pulse 79   Temp 98.3 F (36.8 C) (Oral)   Resp 14   Ht 1.753 m (5\' 9" )   Wt 80 kg   SpO2 97%   BMI 26.05 kg/m  Physical Exam Vitals and nursing note reviewed.  Constitutional:      General: He is not in acute  distress.    Appearance: Normal appearance. He is well-developed. He is not ill-appearing.  HENT:     Head: Normocephalic and atraumatic.     Mouth/Throat:     Mouth: Mucous membranes are moist.  Eyes:     Extraocular Movements: Extraocular movements intact.     Conjunctiva/sclera: Conjunctivae normal.     Pupils: Pupils are equal, round, and reactive to light.  Cardiovascular:     Rate and Rhythm: Normal rate and regular rhythm.     Heart sounds: No murmur heard. Pulmonary:     Effort: Pulmonary effort is normal. No respiratory distress.     Breath sounds: Normal breath sounds.  Abdominal:     Palpations: Abdomen is soft.     Tenderness: There is no abdominal tenderness.  Musculoskeletal:        General: No swelling.     Cervical back: Normal range of motion and neck supple. No tenderness.  Skin:    General: Skin is warm and dry.     Capillary Refill: Capillary refill takes less than 2 seconds.  Neurological:     General: No focal deficit present.     Mental Status: He is alert and oriented to person, place, and time.     Cranial Nerves: No cranial nerve deficit.     Sensory: No sensory deficit.     Motor: No weakness.  Psychiatric:        Mood and Affect: Mood normal.     ED Results / Procedures / Treatments   Labs (all labs ordered are listed, but only abnormal results are displayed) Labs Reviewed  CBC WITH DIFFERENTIAL/PLATELET  BASIC METABOLIC PANEL  MAGNESIUM  URINALYSIS, ROUTINE W REFLEX MICROSCOPIC  RAPID URINE DRUG SCREEN, HOSP PERFORMED  CBG MONITORING, ED    EKG EKG Interpretation Date/Time:  Friday May 29 2023 10:39:19 EDT Ventricular Rate:  77 PR Interval:  166 QRS Duration:  87 QT Interval:  384 QTC Calculation: 435 R Axis:   56  Text Interpretation: Sinus rhythm Left ventricular hypertrophy Minimal ST elevation, inferior leads No significant change since last tracing Confirmed by Vanetta Mulders [phone]) on 05/29/2023 10:40:11  AM  Radiology No results found.  Procedures Procedures    Medications Ordered in ED Medications  0.9 %  sodium chloride infusion (has no administration in time range)  levETIRAcetam (KEPPRA) IVPB 1000 mg/100 mL premix (has no administration in time range)  ondansetron (ZOFRAN) injection 4 mg (has no administration in time range)    ED Course/ Medical Decision Making/ A&P  Medical Decision Making Amount and/or Complexity of Data Reviewed Labs: ordered. Radiology: ordered.  Risk Prescription drug management.   Patient with known history of seizures.  Peers that he had 2 seizures at work today.  Patient missed his dose of Keppra last night.  So we will give IV Keppra load 1000 mg.  Patient on no other antiseizure medicines.  Patient also post seizure had a syncopal episode.  EKG without any acute findings.  But will do cardiac monitoring.  Patient also with complaint of right flank pain.  Known to have a history of kidney stones.  For both of these will get CT renal and CT head.  Will get labs for workup of the seizure.  Patient CT head without any acute findings.  And CT renal study without any ureteral stones.  Has some stones up in the kidneys.  No evidence of any hydronephrosis.  Patient's labs patient metabolic panel creatinine up a little bit at 1.39.  But he did receive IV fluids here.  CBC no leukocytosis hemoglobin normal platelets normal.  Blood sugar was good at 113 fingerstick and urinalysis negative for urinary tract infection.  Urine drug screen only positive for THC.  Patient states he is got his Keppra at home just needs to continue taking that.  And follow-up with his provider.  Patient has remained stable here in the emergency department.  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  Final Clinical Impression(s) / ED Diagnoses Final diagnoses:  Seizure [facility])    Rx / DC Orders ED Discharge Orders     None          Vanetta Mulders, MD 05/29/23 1049    Vanetta Mulders, MD 05/29/23 1050    Vanetta Mulders, MD 05/29/23 1317

## 2023-05-29 NOTE — Discharge Instructions (Signed)
Restart taking your Keppra as directed.  Workup here today without any acute findings.  Other than you were mildly dehydrated but she did receive IV fluids here.  Return for any new or worse symptoms.  Make an appointment to follow-up with your provider.

## 2023-05-29 NOTE — ED Triage Notes (Signed)
Patient arrived via EMS from scene. Patient had witnessed seizure at work. Once EMS arrived patient seizure was over, patient A&Ox4, did not take seizure medication this morning.

## 2023-06-16 ENCOUNTER — Encounter: Payer: Medicare HMO | Admitting: Gastroenterology

## 2023-10-20 ENCOUNTER — Other Ambulatory Visit: Payer: Self-pay | Admitting: Neurology

## 2024-05-03 ENCOUNTER — Encounter: Payer: Self-pay | Admitting: Neurology

## 2024-05-03 ENCOUNTER — Ambulatory Visit (INDEPENDENT_AMBULATORY_CARE_PROVIDER_SITE_OTHER): Admitting: Neurology

## 2024-05-03 VITALS — BP 134/74 | HR 77 | Ht 69.0 in | Wt 163.0 lb

## 2024-05-03 DIAGNOSIS — G40909 Epilepsy, unspecified, not intractable, without status epilepticus: Secondary | ICD-10-CM | POA: Diagnosis not present
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
## 2024-05-03 NOTE — Patient Instructions (Signed)
 Will try patient on clobazam  5 mg nightly for 2 weeks and if able to tolerate the medication then increase to 10 mg nightly Will obtain routine EEG Advised him to contact me if he does have breakthrough seizure or any other side effects Return in 1 year or sooner if worse.

## 2024-05-04 ENCOUNTER — Other Ambulatory Visit: Payer: Self-pay | Admitting: Neurology

## 2024-05-04 ENCOUNTER — Telehealth: Payer: Self-pay | Admitting: Neurology

## 2024-05-04 MED ORDER — CLOBAZAM 10 MG PO TABS: 10.0000 mg | ORAL_TABLET | Freq: Every evening | ORAL | 0 refills | Status: DC

## 2024-05-04 NOTE — Telephone Encounter (Addendum)
 Pt states he has confirmed with  [id number]   That the medication Dr Gregg was supposed to call in for him is not yet called into his pharmacy, please send Rx to  [facility] #[id number]   Phone rep asked pt for the name of the medication, pt does not know the name of the medication Dr Gregg was supposed to call in.

## 2024-05-04 NOTE — Telephone Encounter (Signed)
 Notified patient refill has been sent to the pharmacy.

## 2024-05-04 NOTE — Telephone Encounter (Signed)
 In the office visit note Dr. Gregg stated clobazam  5 mg

## 2024-05-04 NOTE — Telephone Encounter (Signed)
 Pt called wanting to know what the update is on calling his medication is. Spoke to nurse and she stated that we are awaiting for Dr. Janean response. Pt verbalized appreciation.

## 2024-05-04 NOTE — Telephone Encounter (Signed)
Onfi sent to pharmacy.

## 2024-05-06 ENCOUNTER — Telehealth: Payer: Self-pay

## 2024-05-06 ENCOUNTER — Telehealth: Payer: Self-pay | Admitting: Neurology

## 2024-05-06 ENCOUNTER — Other Ambulatory Visit (HOSPITAL_COMMUNITY): Payer: Self-pay

## 2024-05-06 NOTE — Telephone Encounter (Signed)
 [facility]  is asking for a PA for pt's cloBAZam  (ONFI ) 10 MG tablet

## 2024-05-06 NOTE — Telephone Encounter (Signed)
 Pharmacy Patient Advocate Encounter   Received notification from Physician's Office that prior authorization for Clobazam  is required/requested.   Insurance verification completed.   The patient is insured through [facility] .   Per test claim: PA required; PA submitted to above mentioned insurance via Latent Key/confirmation #/EOC [id number] Status is pending

## 2024-05-10 ENCOUNTER — Other Ambulatory Visit (HOSPITAL_COMMUNITY): Payer: Self-pay

## 2024-05-10 NOTE — Telephone Encounter (Signed)
 Pharmacy Patient Advocate Encounter  Received notification from [employer] that Prior Authorization for Clobazam  has been APPROVED from 05/06/2024 to 09/07/2024. Unable to obtain price due to refill too soon rejection, last fill date 05/07/2024 next available fill date9/22/2025   PA #/Case ID/Reference #: [id number]

## 2024-05-31 ENCOUNTER — Telehealth: Payer: Self-pay | Admitting: *Deleted

## 2024-05-31 ENCOUNTER — Other Ambulatory Visit: Admitting: *Deleted

## 2024-05-31 ENCOUNTER — Encounter: Payer: Self-pay | Admitting: *Deleted

## 2024-05-31 NOTE — Telephone Encounter (Signed)
 Pt called to cancel appt due to transportation  Appt Rescheduled

## 2024-06-22 ENCOUNTER — Ambulatory Visit: Admitting: Neurology

## 2024-06-22 DIAGNOSIS — G40909 Epilepsy, unspecified, not intractable, without status epilepticus: Secondary | ICD-10-CM | POA: Diagnosis not present

## 2024-06-22 NOTE — Procedures (Signed)
    History:  63 year old man with seizure   EEG classification: Awake and drowsy  Duration: 27 minutes   Technical aspects: This EEG study was done with scalp electrodes positioned according to the 10-20 International system of electrode placement. Electrical activity was reviewed with band pass filter of 1-70Hz , sensitivity of 7 uV/mm, display speed of 96mm/sec with a 60Hz  notched filter applied as appropriate. EEG data were recorded continuously and digitally stored.   Description of the recording: The background rhythms of this recording consists of a fairly well modulated medium amplitude alpha rhythm of 9 Hz that is reactive to eye opening and closure. Present in the anterior head region is a 15-20 Hz beta activity. Photic stimulation was performed, did not show any abnormalities. Hyperventilation was also performed, did not show any abnormalities. Drowsiness was manifested by background fragmentation. No abnormal epileptiform discharges seen during this recording. There was no focal slowing. There were no electrographic seizure identified.   Abnormality: None   Impression: This is a normal awake and drowsy EEG. No evidence of interictal epileptiform discharges. Normal EEGs, however, do not rule out epilepsy.    Jamilya Sarrazin, MD [facility]

## 2024-06-23 ENCOUNTER — Ambulatory Visit: Payer: Self-pay | Admitting: Neurology

## 2024-07-01 NOTE — Telephone Encounter (Signed)
 Pt has called asking the results to the EEG be read to him.  My chart message from Dr Gregg was read to pt. Pt states since he has been on the new cloBAZam  (ONFI ) 10 MG tablet  he started with 1/2 a pill but experienced a rash on the inside  of his behind.  Pt used tea tree oil and that rash has gone.  Pt now takes has pain in his right pinky toe and he believes these issues are from the cloBAZam  (ONFI ) 10 MG tablet .  Please call pt to discuss.

## 2024-07-04 NOTE — Telephone Encounter (Signed)
 Patient returned call, he states that taking the clobazem makes him sweat really bad. He also developed a rash on his right buttock that tea tree oil helped and complains of pain in the pinky toe. Advise these are likely unrelated to clobazem but would pass along to Dr. Gregg. Patient appreciative of call.

## 2024-07-04 NOTE — Telephone Encounter (Signed)
-----   Message from Olam CHRISTELLA Lather sent at 07/01/2024 10:06 AM EDT -----

## 2024-07-04 NOTE — Progress Notes (Signed)
 Pain in pinky toe not related to Clobazam .  Rash resolved with Tea tree oil tells me that it is not related to Clobazam .

## 2024-07-04 NOTE — Telephone Encounter (Signed)
Call to patient, no answer. Unable to leave message.

## 2024-07-05 NOTE — Progress Notes (Signed)
 I will call him.

## 2024-07-05 NOTE — Progress Notes (Signed)
 I called, no answer, unable to leave a message.

## 2024-07-11 ENCOUNTER — Other Ambulatory Visit: Payer: Self-pay | Admitting: Neurology

## 2024-07-11 MED ORDER — CLOBAZAM 10 MG PO TABS: 10.0000 mg | ORAL_TABLET | Freq: Every evening | ORAL | 5 refills | Status: DC

## 2024-07-11 NOTE — Progress Notes (Signed)
 Spoke with patient, rash is resolved. Will send him more Clobazam 

## 2024-07-28 ENCOUNTER — Telehealth: Payer: Self-pay | Admitting: Neurology

## 2024-07-28 NOTE — Telephone Encounter (Signed)
 Call to wife, advised of Dr. Gregg recommendations. Wife in agreement and verbalized understanding

## 2024-07-28 NOTE — Telephone Encounter (Signed)
 Call to patient, when I introduced myself  and asked who I was speaking with he became angry and said I called him and I should know who I am speaking to. I tried to explain I was verifying who answered and he hung up on me.   Call to wife, she verified patient name and date of birth, on [place]. Wife reports change in behavior where he is more agitated and rude to people, wife says he is off the chain. Denies SI/HI. it is exacerbated if he doesn't get enough sleep. This has all been happening since he started the ONFI . He at the 10 mg ONFI  now and wife reports he was better at the 5 mg dose. She states he is no longer taking Keppra , only ONFI .   Advised I will send to Dr. Gregg to review.

## 2024-07-28 NOTE — Telephone Encounter (Signed)
 Pt's wife is asking for a call from RN to discuss reactions pt is having to new seizure medication.  Wife says he is very unlike himself on new medication, please cal, she does not recall the name of the medicationl.

## 2024-07-28 NOTE — Telephone Encounter (Signed)
 Pt called back angry   I had a hard time tryig to get his name , I explained we have to verify who we are talking to  for every call .  Pt finally gave me his information , Pt was stating that he received a call but no one said what it was for .  He didn't understand the reason for the  call and was yelling . Tried my best to calm Pt down . Informed that MD  would get back to Pt if he had any concerns . I did not inform that wife called  Due to Pt demeanor.

## 2024-07-28 NOTE — Telephone Encounter (Signed)
 Please call and inform spouse to decrease Onfi  back to 5 mg nightly.

## 2024-08-01 ENCOUNTER — Ambulatory Visit: Admitting: Internal Medicine

## 2025-01-03 ENCOUNTER — Ambulatory Visit: Admitting: Cardiology

## 2025-05-03 ENCOUNTER — Ambulatory Visit: Admitting: Neurology
